# Patient Record
Sex: Male | Born: 1970
Health system: Southern US, Community
[De-identification: ages and names within clinical notes are randomized; demographics above are authoritative.]

## PROBLEM LIST (undated history)

## (undated) DIAGNOSIS — I7121 Aneurysm of the ascending aorta, without rupture: Secondary | ICD-10-CM

## (undated) DIAGNOSIS — I712 Thoracic aortic aneurysm, without rupture: Secondary | ICD-10-CM

## (undated) DIAGNOSIS — I10 Essential (primary) hypertension: Secondary | ICD-10-CM

## (undated) HISTORY — DX: Aneurysm of the ascending aorta, without rupture: I71.21

## (undated) HISTORY — DX: Essential (primary) hypertension: I10

## (undated) HISTORY — DX: Thoracic aortic aneurysm, without rupture: I71.2

---

## 1999-11-01 ENCOUNTER — Emergency Department (HOSPITAL_COMMUNITY): Admission: EM | Admit: 1999-11-01 | Discharge: 1999-11-01 | Payer: Self-pay | Admitting: Emergency Medicine

## 1999-11-02 ENCOUNTER — Emergency Department (HOSPITAL_COMMUNITY): Admission: EM | Admit: 1999-11-02 | Discharge: 1999-11-02 | Payer: Self-pay

## 1999-11-07 ENCOUNTER — Emergency Department (HOSPITAL_COMMUNITY): Admission: EM | Admit: 1999-11-07 | Discharge: 1999-11-07 | Payer: Self-pay | Admitting: Emergency Medicine

## 2003-02-13 ENCOUNTER — Encounter: Payer: Self-pay | Admitting: Emergency Medicine

## 2003-02-13 ENCOUNTER — Emergency Department (HOSPITAL_COMMUNITY): Admission: EM | Admit: 2003-02-13 | Discharge: 2003-02-13 | Payer: Self-pay | Admitting: Emergency Medicine

## 2004-02-03 ENCOUNTER — Emergency Department (HOSPITAL_COMMUNITY): Admission: EM | Admit: 2004-02-03 | Discharge: 2004-02-03 | Payer: Self-pay | Admitting: Family Medicine

## 2013-12-09 ENCOUNTER — Emergency Department (HOSPITAL_COMMUNITY)
Admission: EM | Admit: 2013-12-09 | Discharge: 2013-12-09 | Disposition: A | Discharge status: Home / Self Care | Payer: Self-pay | Attending: Emergency Medicine | Admitting: Emergency Medicine

## 2013-12-09 ENCOUNTER — Emergency Department (HOSPITAL_COMMUNITY): Payer: Self-pay

## 2013-12-09 ENCOUNTER — Encounter (HOSPITAL_COMMUNITY): Payer: Self-pay | Admitting: Emergency Medicine

## 2013-12-09 DIAGNOSIS — Z791 Long term (current) use of non-steroidal anti-inflammatories (NSAID): Secondary | ICD-10-CM | POA: Insufficient documentation

## 2013-12-09 DIAGNOSIS — M7730 Calcaneal spur, unspecified foot: Secondary | ICD-10-CM | POA: Insufficient documentation

## 2013-12-09 DIAGNOSIS — M7732 Calcaneal spur, left foot: Secondary | ICD-10-CM

## 2013-12-09 DIAGNOSIS — M722 Plantar fascial fibromatosis: Secondary | ICD-10-CM | POA: Insufficient documentation

## 2013-12-09 MED ORDER — NAPROXEN 500 MG PO TABS: 500.0000 mg | ORAL_TABLET | Freq: Two times a day (BID) | ORAL | Status: DC

## 2013-12-09 NOTE — ED Provider Notes (Signed)
CSN: [id number]     Arrival date & time 12/09/13  1019 History   First MD Initiated Contact with Patient 12/09/13 1023     Chief Complaint  Patient presents with  . Foot Pain    left     (Consider location/radiation/quality/duration/timing/severity/associated sxs/prior Treatment) HPI Comments: 43 year old male presents to the emergency department complaining of left foot pain "for a while" worsening over the past few days. No known injury or trauma. Pain worse first thing in the morning, easing off throughout the day. Pain also worse if he is sitting in his truck for long period of time and then goes to walk. States the pain starts near his heel and radiates to the bottom of his foot. He has not tried any alleviating factors for his symptoms. He has not tried any alleviating factors for his symptoms. Denies fevers, numbness or tingling.  Patient is a 43 y.o. male presenting with lower extremity pain. The history is provided by the patient.  Foot Pain Pertinent negatives include no fever or numbness.    History reviewed. No pertinent past medical history. History reviewed. No pertinent past surgical history. No family history on file. History  Substance Use Topics  . Smoking status: Never Smoker   . Smokeless tobacco: Not on file  . Alcohol Use: Yes    Review of Systems  Constitutional: Negative for fever.  HENT: Negative.   Musculoskeletal:       Positive for left foot pain.  Skin: Negative.   Neurological: Negative for numbness.      Allergies  Review of patient's allergies indicates no known allergies.  Home Medications   Prior to Admission medications   Medication Sig Start Date End Date Taking? Authorizing Provider  naproxen (NAPROSYN) 500 MG tablet Take 1 tablet (500 mg total) by mouth 2 (two) times daily. 12/09/13   Illene Labrador, PA-C   BP 144/101  Pulse 90  Temp(Src) 98 F (36.7 C) (Oral)  Resp 20  SpO2 99% Physical Exam  Nursing note and vitals  reviewed. Constitutional: He is oriented to person, place, and time. He appears well-developed and well-nourished. No distress.  HENT:  Head: Normocephalic and atraumatic.  Eyes: Conjunctivae and EOM are normal.  Neck: Normal range of motion. Neck supple.  Cardiovascular: Normal rate, regular rhythm and normal heart sounds.   +2 PT/DP pulse on the left.  Pulmonary/Chest: Effort normal and breath sounds normal.  Musculoskeletal: Normal range of motion. He exhibits no edema.  Left foot tender to palpation over his calcaneus and plantar aspect of his foot. No swelling or deformity. Full range of motion of left foot and ankle. Achilles tendon normal. Normal gait.  Neurological: He is alert and oriented to person, place, and time.  Sensation intact  Skin: Skin is warm and dry.  Psychiatric: He has a normal mood and affect. His behavior is normal.    ED Course  Procedures (including critical care time) Labs Review Labs Reviewed - No data to display  Imaging Review Dg Foot Complete Left  12/09/2013   CLINICAL DATA:  Left heel pain.  EXAM: LEFT FOOT - COMPLETE 3+ VIEW  COMPARISON:  None.  FINDINGS: No acute bony or joint abnormality is identified. A small well corticated plantar calcaneal spur is noted. Soft tissue structures are unremarkable.  IMPRESSION: No acute abnormality.  Small plantar calcaneal spur.   Electronically Signed   By: Inge Rise M.D.   On: 12/09/2013 10:41     EKG Interpretation None  MDM   Final diagnoses:  Plantar fasciitis of left foot  Calcaneal spur of foot, left   Patient presenting with left foot pain, no known injury or trauma, worse in the morning or after being non-ambulatory for a while. Symptoms consistent with plantar fasciitis. X-ray obtained prior to patient being seen, no acute abnormality, small plantar calcaneal spur. I discussed certain stretches for plantar fasciitis including rolling a lacrosse ball or golf ball underneath his foot, or a  frozen water bottle underneath his foot. Naproxen for pain. Stable for discharge. Resources given for PCP followup. Return precautions given. Patient states understanding of treatment care plan and is agreeable.  Illene Labrador, PA-C 12/09/13 1108

## 2013-12-09 NOTE — Discharge Instructions (Signed)
Take naproxen daily as directed. Freeze a water bottle and roll beneath your foot to stretch your plantar fascia. You may also use a golf ball or lacrosse ball to stretch out your plantar fascia.  Heel Spur A heel spur is a hook of bone that can form on the calcaneus (the heel bone and the largest bone of the foot). Heel spurs are often associated with plantar fasciitis and usually come in people who have had the problem for an extended period of time. The cause of the relationship is unknown. The pain associated with them is thought to be caused by an inflammation (soreness and redness) of the plantar fascia rather than the spur itself. The plantar fascia is a thick fibrous like tissue that runs from the calcaneus (heel bone) to the ball of the foot. This strong, tight tissue helps maintain the arch of your foot. It helps distribute the weight across your foot as you walk or run. Stresses placed on the plantar fascia can be tremendous. When it is inflamed normal activities become painful. Pain is worse in the morning after sleeping. After sleeping the plantar fascia is tight. The first movements stretch the fascia and this causes pain. As the tendon loosens, the pain usually gets better. It often returns with too much standing or walking.  About 70% of patients with plantar fasciitis have a heel spur. About half of people without foot pain also have heel spurs. DIAGNOSIS  The diagnosis of a heel spur is made by X-ray. The X-ray shows a hook of bone protruding from the bottom of the calcaneus at the point where the plantar fascia is attached to the heel bone.  TREATMENT  It is necessary to find out what is causing the stretching of the plantar fascia. If the cause is over-pronation (flat feet), orthotics and proper foot ware may help.  Stretching exercises, losing weight, wearing shoes that have a cushioned heel that absorbs shock, and elevating the heel with the use of a heel cradle, heel cup, or  orthotics may all help. Heel cradles and heel cups provide extra comfort and cushion to the heel, and reduce the amount of shock to the sore area. AVOIDING THE PAIN OF PLANTAR FASCIITIS AND HEEL SPURS  Consult a sports medicine professional before beginning a new exercise program.  Walking programs offer a good workout. There is a lower chance of overuse injuries common to the runners. There is less impact and less jarring of the joints.  Begin all new exercise programs slowly. If problems or pains develop, decrease the amount of time or distance until you are at a comfortable level.  Wear good shoes and replace them regularly.  Stretch your foot and the heel cords at the back of the ankle (Achilles tendons) both before and after exercise.  Run or exercise on even surfaces that are not hard. For example, asphalt is better than pavement.  Do not run barefoot on hard surfaces.  If using a treadmill, vary the incline.  Do not continue to workout if you have foot or joint problems. Seek professional help if they do not improve. HOME CARE INSTRUCTIONS   Avoid activities that cause you pain until you recover.  Use ice or cold packs to the problem or painful areas after working out.  Only take over-the-counter or prescription medicines for pain, discomfort, or fever as directed by your caregiver.  Soft shoe inserts or athletic shoes with air or gel sole cushions may be helpful.  If problems  continue or become more severe, consult a sports medicine caregiver. Cortisone is a potent anti-inflammatory medication that may be injected into the painful area. You can discuss this treatment with your caregiver. MAKE SURE YOU:   Understand these instructions.  Will watch your condition.  Will get help right away if you are not doing well or get worse. Document Released: 06/29/2005 Document Revised: 08/15/2011 Document Reviewed: 08/31/2005 [facility] Patient Information 2015 [place], [place]. This  information is not intended to replace advice given to you by your health care provider. Make sure you discuss any questions you have with your health care provider.  Plantar Fasciitis (Heel Spur Syndrome) with Rehab The plantar fascia is a fibrous, ligament-like, soft-tissue structure that spans the bottom of the foot. Plantar fasciitis is a condition that causes pain in the foot due to inflammation of the tissue. SYMPTOMS   Pain and tenderness on the underneath side of the foot.  Pain that worsens with standing or walking. CAUSES  Plantar fasciitis is caused by irritation and injury to the plantar fascia on the underneath side of the foot. Common mechanisms of injury include:  Direct trauma to bottom of the foot.  Damage to a small nerve that runs under the foot where the main fascia attaches to the heel bone.  Stress placed on the plantar fascia due to bone spurs. RISK INCREASES WITH:   Activities that place stress on the plantar fascia (running, jumping, pivoting, or cutting).  Poor strength and flexibility.  Improperly fitted shoes.  Tight calf muscles.  Flat feet.  Failure to warm-up properly before activity.  Obesity. PREVENTION  Warm up and stretch properly before activity.  Allow for adequate recovery between workouts.  Maintain physical fitness:  Strength, flexibility, and endurance.  Cardiovascular fitness.  Maintain a health body weight.  Avoid stress on the plantar fascia.  Wear properly fitted shoes, including arch supports for individuals who have flat feet. PROGNOSIS  If treated properly, then the symptoms of plantar fasciitis usually resolve without surgery. However, occasionally surgery is necessary. RELATED COMPLICATIONS   Recurrent symptoms that may result in a chronic condition.  Problems of the lower back that are caused by compensating for the injury, such as limping.  Pain or weakness of the foot during push-off following  surgery.  Chronic inflammation, scarring, and partial or complete fascia tear, occurring more often from repeated injections. TREATMENT  Treatment initially involves the use of ice and medication to help reduce pain and inflammation. The use of strengthening and stretching exercises may help reduce pain with activity, especially stretches of the Achilles tendon. These exercises may be performed at home or with a therapist. Your caregiver may recommend that you use heel cups of arch supports to help reduce stress on the plantar fascia. Occasionally, corticosteroid injections are given to reduce inflammation. If symptoms persist for greater than 6 months despite non-surgical (conservative), then surgery may be recommended.  MEDICATION   If pain medication is necessary, then nonsteroidal anti-inflammatory medications, such as aspirin and ibuprofen, or other minor pain relievers, such as acetaminophen, are often recommended.  Do not take pain medication within 7 days before surgery.  Prescription pain relievers may be given if deemed necessary by your caregiver. Use only as directed and only as much as you need.  Corticosteroid injections may be given by your caregiver. These injections should be reserved for the most serious cases, because they may only be given a certain number of times. HEAT AND COLD  Cold treatment (icing)  relieves pain and reduces inflammation. Cold treatment should be applied for 10 to 15 minutes every 2 to 3 hours for inflammation and pain and immediately after any activity that aggravates your symptoms. Use ice packs or massage the area with a piece of ice (ice massage).  Heat treatment may be used prior to performing the stretching and strengthening activities prescribed by your caregiver, physical therapist, or athletic trainer. Use a heat pack or soak the injury in warm water. SEEK IMMEDIATE MEDICAL CARE IF:  Treatment seems to offer no benefit, or the condition  worsens.  Any medications produce adverse side effects. EXERCISES RANGE OF MOTION (ROM) AND STRETCHING EXERCISES - Plantar Fasciitis (Heel Spur Syndrome) These exercises may help you when beginning to rehabilitate your injury. Your symptoms may resolve with or without further involvement from your physician, physical therapist or athletic trainer. While completing these exercises, remember:   Restoring tissue flexibility helps normal motion to return to the joints. This allows healthier, less painful movement and activity.  An effective stretch should be held for at least 30 seconds.  A stretch should never be painful. You should only feel a gentle lengthening or release in the stretched tissue. RANGE OF MOTION - Toe Extension, Flexion  Sit with your right / left leg crossed over your opposite knee.  Grasp your toes and gently pull them back toward the top of your foot. You should feel a stretch on the bottom of your toes and/or foot.  Hold this stretch for __________ seconds.  Now, gently pull your toes toward the bottom of your foot. You should feel a stretch on the top of your toes and or foot.  Hold this stretch for __________ seconds. Repeat __________ times. Complete this stretch __________ times per day.  RANGE OF MOTION - Ankle Dorsiflexion, Active Assisted  Remove shoes and sit on a chair that is preferably not on a carpeted surface.  Place right / left foot under knee. Extend your opposite leg for support.  Keeping your heel down, slide your right / left foot back toward the chair until you feel a stretch at your ankle or calf. If you do not feel a stretch, slide your bottom forward to the edge of the chair, while still keeping your heel down.  Hold this stretch for __________ seconds. Repeat __________ times. Complete this stretch __________ times per day.  STRETCH - Gastroc, Standing  Place hands on wall.  Extend right / left leg, keeping the front knee somewhat  bent.  Slightly point your toes inward on your back foot.  Keeping your right / left heel on the floor and your knee straight, shift your weight toward the wall, not allowing your back to arch.  You should feel a gentle stretch in the right / left calf. Hold this position for __________ seconds. Repeat __________ times. Complete this stretch __________ times per day. STRETCH - Soleus, Standing  Place hands on wall.  Extend right / left leg, keeping the other knee somewhat bent.  Slightly point your toes inward on your back foot.  Keep your right / left heel on the floor, bend your back knee, and slightly shift your weight over the back leg so that you feel a gentle stretch deep in your back calf.  Hold this position for __________ seconds. Repeat __________ times. Complete this stretch __________ times per day. STRETCH - Gastrocsoleus, Standing  Note: This exercise can place a lot of stress on your foot and ankle. Please complete this  exercise only if specifically instructed by your caregiver.   Place the ball of your right / left foot on a step, keeping your other foot firmly on the same step.  Hold on to the wall or a rail for balance.  Slowly lift your other foot, allowing your body weight to press your heel down over the edge of the step.  You should feel a stretch in your right / left calf.  Hold this position for __________ seconds.  Repeat this exercise with a slight bend in your right / left knee. Repeat __________ times. Complete this stretch __________ times per day.  STRENGTHENING EXERCISES - Plantar Fasciitis (Heel Spur Syndrome)  These exercises may help you when beginning to rehabilitate your injury. They may resolve your symptoms with or without further involvement from your physician, physical therapist or athletic trainer. While completing these exercises, remember:   Muscles can gain both the endurance and the strength needed for everyday activities through  controlled exercises.  Complete these exercises as instructed by your physician, physical therapist or athletic trainer. Progress the resistance and repetitions only as guided. STRENGTH - Towel Curls  Sit in a chair positioned on a non-carpeted surface.  Place your foot on a towel, keeping your heel on the floor.  Pull the towel toward your heel by only curling your toes. Keep your heel on the floor.  If instructed by your physician, physical therapist or athletic trainer, add ____________________ at the end of the towel. Repeat __________ times. Complete this exercise __________ times per day. STRENGTH - Ankle Inversion  Secure one end of a rubber exercise band/tubing to a fixed object (table, pole). Loop the other end around your foot just before your toes.  Place your fists between your knees. This will focus your strengthening at your ankle.  Slowly, pull your big toe up and in, making sure the band/tubing is positioned to resist the entire motion.  Hold this position for __________ seconds.  Have your muscles resist the band/tubing as it slowly pulls your foot back to the starting position. Repeat __________ times. Complete this exercises __________ times per day.  Document Released: 05/23/2005 Document Revised: 08/15/2011 Document Reviewed: 09/04/2008 [facility] Patient Information 2015 [place], [place]. This information is not intended to replace advice given to you by your health care provider. Make sure you discuss any questions you have with your health care provider.  Plantar Fasciitis Plantar fasciitis is a common condition that causes foot pain. It is soreness (inflammation) of the band of tough fibrous tissue on the bottom of the foot that runs from the heel bone (calcaneus) to the ball of the foot. The cause of this soreness may be from excessive standing, poor fitting shoes, running on hard surfaces, being overweight, having an abnormal walk, or overuse (this is common in  runners) of the painful foot or feet. It is also common in aerobic exercise dancers and ballet dancers. SYMPTOMS  Most people with plantar fasciitis complain of:  Severe pain in the morning on the bottom of their foot especially when taking the first steps out of bed. This pain recedes after a few minutes of walking.  Severe pain is experienced also during walking following a long period of inactivity.  Pain is worse when walking barefoot or up stairs DIAGNOSIS   Your caregiver will diagnose this condition by examining and feeling your foot.  Special tests such as X-rays of your foot, are usually not needed. PREVENTION   Consult a sports medicine professional  before beginning a new exercise program.  Walking programs offer a good workout. With walking there is a lower chance of overuse injuries common to runners. There is less impact and less jarring of the joints.  Begin all new exercise programs slowly. If problems or pain develop, decrease the amount of time or distance until you are at a comfortable level.  Wear good shoes and replace them regularly.  Stretch your foot and the heel cords at the back of the ankle (Achilles tendon) both before and after exercise.  Run or exercise on even surfaces that are not hard. For example, asphalt is better than pavement.  Do not run barefoot on hard surfaces.  If using a treadmill, vary the incline.  Do not continue to workout if you have foot or joint problems. Seek professional help if they do not improve. HOME CARE INSTRUCTIONS   Avoid activities that cause you pain until you recover.  Use ice or cold packs on the problem or painful areas after working out.  Only take over-the-counter or prescription medicines for pain, discomfort, or fever as directed by your caregiver.  Soft shoe inserts or athletic shoes with air or gel sole cushions may be helpful.  If problems continue or become more severe, consult a sports medicine  caregiver or your own health care provider. Cortisone is a potent anti-inflammatory medication that may be injected into the painful area. You can discuss this treatment with your caregiver. MAKE SURE YOU:   Understand these instructions.  Will watch your condition.  Will get help right away if you are not doing well or get worse. Document Released: 02/15/2001 Document Revised: 08/15/2011 Document Reviewed: 04/16/2008 [facility] Patient Information 2015 [place], [place]. This information is not intended to replace advice given to you by your health care provider. Make sure you discuss any questions you have with your health care provider.

## 2013-12-09 NOTE — ED Notes (Signed)
Pt left foot pain that has been hurting pt for a while, denies injury but states it has gotten worse.

## 2013-12-10 NOTE — ED Provider Notes (Signed)
Medical screening examination/treatment/procedure(s) were performed by non-physician practitioner and as supervising physician I was immediately available for consultation/collaboration.    Mirna Mires, MD 12/10/13 [phone]

## 2014-08-17 ENCOUNTER — Encounter (HOSPITAL_COMMUNITY): Payer: Self-pay

## 2014-08-17 ENCOUNTER — Emergency Department (HOSPITAL_COMMUNITY)
Admission: EM | Admit: 2014-08-17 | Discharge: 2014-08-18 | Disposition: A | Discharge status: Home / Self Care | Payer: Self-pay | Attending: Emergency Medicine | Admitting: Emergency Medicine

## 2014-08-17 ENCOUNTER — Emergency Department (HOSPITAL_COMMUNITY): Payer: Self-pay

## 2014-08-17 DIAGNOSIS — J069 Acute upper respiratory infection, unspecified: Secondary | ICD-10-CM | POA: Insufficient documentation

## 2014-08-17 DIAGNOSIS — I719 Aortic aneurysm of unspecified site, without rupture: Secondary | ICD-10-CM | POA: Insufficient documentation

## 2014-08-17 DIAGNOSIS — R05 Cough: Secondary | ICD-10-CM

## 2014-08-17 DIAGNOSIS — I77810 Thoracic aortic ectasia: Secondary | ICD-10-CM

## 2014-08-17 DIAGNOSIS — R042 Hemoptysis: Secondary | ICD-10-CM | POA: Insufficient documentation

## 2014-08-17 DIAGNOSIS — R059 Cough, unspecified: Secondary | ICD-10-CM

## 2014-08-17 LAB — CBC WITH DIFFERENTIAL/PLATELET
BASOS ABS: 0 10*3/uL (ref 0.0–0.1)
Basophils Relative: 0 % (ref 0–1)
EOS ABS: 0 10*3/uL (ref 0.0–0.7)
Eosinophils Relative: 0 % (ref 0–5)
HCT: 45.2 % (ref 39.0–52.0)
Hemoglobin: 15.6 g/dL (ref 13.0–17.0)
LYMPHS ABS: 1.3 10*3/uL (ref 0.7–4.0)
Lymphocytes Relative: 42 % (ref 12–46)
MCH: 30.3 pg (ref 26.0–34.0)
MCHC: 34.5 g/dL (ref 30.0–36.0)
MCV: 87.8 fL (ref 78.0–100.0)
MONOS PCT: 13 % — AB (ref 3–12)
Monocytes Absolute: 0.4 10*3/uL (ref 0.1–1.0)
NEUTROS ABS: 1.4 10*3/uL — AB (ref 1.7–7.7)
NEUTROS PCT: 45 % (ref 43–77)
Platelets: 189 10*3/uL (ref 150–400)
RBC: 5.15 MIL/uL (ref 4.22–5.81)
RDW: 13.1 % (ref 11.5–15.5)
WBC: 3.1 10*3/uL — ABNORMAL LOW (ref 4.0–10.5)

## 2014-08-17 LAB — BASIC METABOLIC PANEL
Anion gap: 8 (ref 5–15)
BUN: 14 mg/dL (ref 6–23)
CALCIUM: 8.9 mg/dL (ref 8.4–10.5)
CO2: 26 mmol/L (ref 19–32)
Chloride: 103 mmol/L (ref 96–112)
Creatinine, Ser: 1.04 mg/dL (ref 0.50–1.35)
GFR calc Af Amer: >90 mL/min (ref >90)
GFR calc non Af Amer: 86 mL/min — ABNORMAL LOW (ref >90)
GLUCOSE: 101 mg/dL — AB (ref 70–99)
Potassium: 3.5 mmol/L (ref 3.5–5.1)
SODIUM: 137 mmol/L (ref 135–145)

## 2014-08-17 LAB — I-STAT CHEM 8, ED
BUN: 19 mg/dL (ref 6–23)
CALCIUM ION: 1.1 mmol/L — AB (ref 1.12–1.23)
CREATININE: 1.1 mg/dL (ref 0.50–1.35)
Chloride: 100 mmol/L (ref 96–112)
GLUCOSE: 103 mg/dL — AB (ref 70–99)
HEMATOCRIT: 50 % (ref 39.0–52.0)
Hemoglobin: 17 g/dL (ref 13.0–17.0)
Potassium: 4.1 mmol/L (ref 3.5–5.1)
Sodium: 139 mmol/L (ref 135–145)
TCO2: 24 mmol/L (ref 0–100)

## 2014-08-17 MED ORDER — GUAIFENESIN-CODEINE 100-10 MG/5ML PO SOLN: 5.0000 mL | Freq: Four times a day (QID) | ORAL | Status: DC | PRN

## 2014-08-17 MED ORDER — AZITHROMYCIN 250 MG PO TABS: ORAL_TABLET | ORAL | Status: DC

## 2014-08-17 MED ORDER — IOHEXOL 350 MG/ML SOLN
100.0000 mL | Freq: Once | INTRAVENOUS | Status: AC | PRN
  Administered 2014-08-17: 100 mL via INTRAVENOUS

## 2014-08-17 NOTE — ED Notes (Signed)
Pt reports he has had a cough since Wednesday of last week. Pt reports he has been coughing up some blood.

## 2014-08-17 NOTE — ED Provider Notes (Signed)
CSN: [id number]     Arrival date & time 08/17/14  1633 History   First MD Initiated Contact with Patient 08/17/14 2109     Chief Complaint  Patient presents with  . Cough     (Consider location/radiation/quality/duration/timing/severity/associated sxs/prior Treatment) HPI  Sean Hampton is a(n) 44 y.o. male who presents to the ED with cc of Hemoptysis. The patient developed URI sxs about 1 week ago with associated nasal congestion, sore throat. Patient states that over the past few days he has been coughing up bloody sputum. Patient presents with a water bottle full of pink tinged sputum. He denies fevers, chills, shortness of breath. He denies any recent confinement, unilateral calf swelling, cancer. Patient denies smoking. 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  No past medical history on file. History reviewed. No pertinent past surgical history. Family History  Problem Relation Age of Onset  . Diabetes Mother   . Diabetes Father    History  Substance Use Topics  . Smoking status: Never Smoker   . Smokeless tobacco: Never Used  . Alcohol Use: Yes     Comment: occasionally    Review of Systems  Ten systems reviewed and are negative for acute change, except as noted in the HPI.    Allergies  Review of patient's allergies indicates no known allergies.  Home Medications   Prior to Admission medications   Medication Sig Start Date End Date Taking? Authorizing Provider  azithromycin (ZITHROMAX Z-PAK) 250 MG tablet 2 po day one, then 1 daily x 4 days 08/17/14   Margarita Mail, PA-C  naproxen (NAPROSYN) 500 MG tablet Take 1 tablet (500 mg total) by mouth 2 (two) times daily. Patient not taking: Reported on 08/17/2014 12/09/13   Hessie Diener Hess, PA-C   BP 132/75 mmHg  Pulse 104  Temp(Src) 98.7 F (37.1 C) (Oral)  Resp 18  SpO2 99% Physical Exam  Constitutional: He appears  well-developed and well-nourished. No distress.  HENT:  Head: Normocephalic and atraumatic.  Mouth/Throat: Uvula is midline. Posterior oropharyngeal erythema present. No oropharyngeal exudate, posterior oropharyngeal edema or tonsillar abscesses.  Eyes: Conjunctivae are normal. No scleral icterus.  Neck: Normal range of motion. Neck supple.  Cardiovascular: Normal rate, regular rhythm and normal heart sounds.   Pulmonary/Chest: Effort normal and breath sounds normal. No respiratory distress.  Abdominal: Soft. There is no tenderness.  Musculoskeletal: He exhibits no edema.  Neurological: He is alert.  Skin: Skin is warm and dry. No petechiae noted. He is not diaphoretic.  Psychiatric: His behavior is normal.  Nursing note and vitals reviewed.   ED Course  Procedures (including critical care time) Labs Review Labs Reviewed  CBC WITH DIFFERENTIAL/PLATELET  BASIC METABOLIC PANEL  I-STAT CHEM 8, ED    Imaging Review Dg Chest 2 View  08/17/2014   CLINICAL DATA:  Three day history of cough, chest congestion and shortness of breath. Possible exposure to asbestos this week while working.  EXAM: CHEST  2 VIEW  COMPARISON:  None.  FINDINGS: Cardiomediastinal silhouette unremarkable. Lungs clear. Bronchovascular markings normal. Pulmonary vascularity normal. No pneumothorax. No pleural effusions. Visualized bony thorax intact.  IMPRESSION: Normal examination.   Electronically Signed   By: Evangeline Dakin M.D.   On: 08/17/2014 18:40     EKG Interpretation None      MDM   Final diagnoses:  Cough  URI (upper respiratory  infection)  Essential hypertension  Cough with hemoptysis    9:59 PM BP 132/75 mmHg  Pulse 104  Temp(Src) 98.7 F (37.1 C) (Oral)  Resp 18  SpO2 99%  Patient with tachycardia, hypertension, hemoptysis. He has a negative chest x-ray. Labs are currently pending. Patient will need CTA to rule out PE. HDS.     Patient CTA negative for PE or other major pulmonary  abnormality. Ct does does dilated Aortic Arch.  I have discussed the imaging findings.  Patient is uninsured and will be referred to the [facility] for mgmt of his HTN and he has been given a referral to Vascular for mgmt of his aortic dilatation. Patient advised of the need for monitioring.   Margarita Mail, PA-C 08/19/14 1218  Charlesetta Shanks, MD 08/24/14 [phone]

## 2014-08-17 NOTE — Discharge Instructions (Signed)
Hemoptysis Hemoptysis, which means coughing up blood, can be a sign of a minor problem or a serious medical condition. The blood that is coughed up may come from the lungs and airways. Coughed-up blood can also come from bleeding that occurs outside the lungs and airways. Blood can drain into the windpipe during a severe nosebleed or when blood is vomited from the stomach. Because hemoptysis can be a sign of something serious, a medical evaluation is required. For some people with hemoptysis, no definite cause is ever identified. CAUSES  The most common cause of hemoptysis is bronchitis. Some other common causes include:   A ruptured blood vessel caused by coughing or an infection.   A medical condition that causes damage to the large air passageways (bronchiectasis).   A blood clot in the lungs (pulmonary embolism).   Pneumonia.   Tuberculosis.   Breathing in a small foreign object.   Cancer. For some people with hemoptysis, no definite cause is ever identified.  HOME CARE INSTRUCTIONS  Only take over-the-counter or prescription medicines as directed by your caregiver. Do not use cough suppressants unless your caregiver approves.  If your caregiver prescribes antibiotic medicines, take them as directed. Finish them even if you start to feel better.  Do not smoke. Also avoid secondhand smoke.  Follow up with your caregiver as directed. SEEK IMMEDIATE MEDICAL CARE IF:   You cough up bloody mucus for longer than a week.  You have a blood-producing cough that is severe or getting worse.  You have a blood-producing cough thatcomes and goes over time.  You develop problems with your breathing.   You vomit blood.  You develop bloody or black-colored stools.  You have chest pain.   You develop night sweats.  You feel faint or pass out.   You have a fever or persistent symptoms for more than 2-3 days.  You have a fever and your symptoms suddenly get worse. MAKE  SURE YOU:  Understand these instructions.  Will watch your condition.  Will get help right away if you are not doing well or get worse. Document Released: 08/01/2001 Document Revised: 05/09/2012 Document Reviewed: 03/09/2012 [facility] Patient Information 2015 [place], [place]. This information is not intended to replace advice given to you by your health care provider. Make sure you discuss any questions you have with your health care provider.  Thoracic Aortic Aneurysm An aneurysm is a bulge in an artery. It happens when the wall of the artery is weakened or damaged. If the aneurysm gets too big, it bursts (ruptures) and severe bleeding occurs. A thoracic aortic aneurysm is an aneurysm that occurs in the first part of the aorta, between the heart and the diaphragm. The aorta is the main artery and supplies blood from the heart to the rest of the body. A thoracic aortic aneurysm can enlarge and rupture or blood can flow between the layers of the wall of the aorta through a tear (aorticdissection). Both of these conditions can cause bleeding inside the body and can be life threatening unless diagnosed and treated promptly. CAUSES  The exact cause of a thoracic aortic aneurysm is often unknown. Some contributing factors are:   A hardening of the arteries caused by the buildup of fat and other substances in the lining of a blood vessel (arteriosclerosis).  Inflammation of the walls of an artery (arteritis).  Connective tissue diseases, such as Marfan syndrome.  Injury or trauma to the aorta.  An infection, such as syphilis or staphylococcus, in  the wall of the aorta (infectious aortitis) caused by bacteria. RISK FACTORS  Risk factors that contribute to a thoracic aortic aneurysm may include:  Age older than 39 years.  High blood pressure (hypertension).  Male gender.  Ethnicity (white race).  Obesity.  Family history of aneurysm (first degree relatives only).  Tobacco  use. PREVENTION  The following healthy lifestyle habits may help decrease your risk of a thoracic aortic aneurysm:  Quitting smoking. Smoking can raise your blood pressure and cause arteriosclerosis.  Limiting or avoiding alcohol.  Keeping your blood pressure, blood sugar level, and cholesterol levels within normal limits.  Decreasing your salt intake. In some people, too much salt can raise blood pressure and increase your risk of abdominal aortic aneurysm.  Eating a diet low in saturated fats and cholesterol.  Increasing your fiber intake by including whole grains, vegetables, and fruits in your diet. Eating these foods may help lower blood pressure.  Maintaining a healthy weight.  Staying physically active and exercising regularly. SYMPTOMS  The symptoms of thoracic aortic aneurysm may vary depending on the size and rate of growth of the aneurysm. Most grow slowly and do not have any symptoms. When symptoms do occur, they may include:  Pain (chest, back, sides, or abdomen). The pain may vary in intensity. A sudden onset of severe pain may indicate that the aneurysm has ruptured.  Hoarseness.  Cough.  Shortness of breath.  Swallowing problems.  Nausea or vomiting or both. DIAGNOSIS  Since most unruptured thoracic aortic aneurysms have no symptoms, they are often discovered during diagnostic exams for other conditions. An aneurysm may be found during the following procedures:  Ultrasonography (a one-time screening for thoracic aortic aneurysm by ultrasonography is also recommended for all men aged 42-75 years who have ever smoked).  X-ray exams.  A CT scan.  An MRI.  Angiography or arteriography. TREATMENT  Treatment of a thoracic aortic aneurysm depends on the size of your aneurysm, your age, and risk factors for rupture. Medicine to control blood pressure and pain may be used to manage aneurysms smaller than 2.3 in (6 cm). Regular monitoring for enlargement may be  recommended by your health care provider if:  The aneurysm is 1.2-1.5 in (3-4 cm) in size (an annual ultrasonography may be recommended).  The aneurysm is 1.5-1.8 in (4-4.5 cm) in size (an ultrasonography every 6 months may be recommended).  The aneurysm is larger than 1.8 in (4.5 cm) in size (your health care provider may ask that you be examined by a vascular surgeon). If your aneurysm is larger than 2.2 in (5.5 cm) or if it is enlarging quickly, surgical repair may be recommended. There are two main methods for repair of an aneurysm:   Endovascular repair (a minimally invasive surgery).  Open repair. This method is used if an endovascular repair is not possible. Document Released: 05/23/2005 Document Revised: 03/13/2013 Document Reviewed: 12/03/2012 [facility] Patient Information 2015 [place], [place]. This information is not intended to replace advice given to you by your health care provider. Make sure you discuss any questions you have with your health care provider.

## 2014-08-17 NOTE — ED Notes (Signed)
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 Patient states the has been having a productive cough with white sputum streaked with blood, headache  since being on the job.

## 2014-08-20 ENCOUNTER — Institutional Professional Consult (permissible substitution) (INDEPENDENT_AMBULATORY_CARE_PROVIDER_SITE_OTHER): Payer: Self-pay | Admitting: Surgery

## 2014-08-20 ENCOUNTER — Encounter: Payer: Self-pay | Admitting: Surgery

## 2014-08-20 VITALS — BP 135/96 | HR 100 | Resp 20 | Ht 72.0 in | Wt 240.0 lb

## 2014-08-20 DIAGNOSIS — I712 Thoracic aortic aneurysm, without rupture: Secondary | ICD-10-CM

## 2014-08-20 DIAGNOSIS — I7121 Aneurysm of the ascending aorta, without rupture: Secondary | ICD-10-CM

## 2014-08-20 NOTE — Progress Notes (Signed)
Cardiothoracic Surgery Consultation   PCP is No PCP Per Patient Referring Provider is Charlesetta Shanks, MD  Chief Complaint  Patient presents with  . Thoracic Aortic Aneurysm    Surgical eval on ascending thoracic aorta, Chest CTA 08/16/13     HPI:  The patient is a 44 year old gentleman with no prior medical history who does not have a PCP and was seen in the ER recently due to coughing up blood tinged sputum for a few days after having a URI. He had a CT scan of the chest that showed a 4.1 cm ascending aortic aneurysm but no other abnormality. He was treated with Zithomax and guaifenesin and his hemoptysis has resolved.  History reviewed. No pertinent past medical history.  History reviewed. No pertinent past surgical history.  Family History  Problem Relation Age of Onset  . Diabetes Mother   . Diabetes Father   No family history of aneurysm disease  Social History History  Substance Use Topics  . Smoking status: Never Smoker   . Smokeless tobacco: Never Used  . Alcohol Use: Yes     Comment: occasionally    Current Outpatient Prescriptions  Medication Sig Dispense Refill  . azithromycin (ZITHROMAX Z-PAK) 250 MG tablet 2 po day one, then 1 daily x 4 days 5 tablet 0  . guaiFENesin-codeine 100-10 MG/5ML syrup Take 5-10 mLs by mouth every 6 (six) hours as needed for cough. 120 mL 0  . naproxen (NAPROSYN) 500 MG tablet Take 1 tablet (500 mg total) by mouth 2 (two) times daily. 20 tablet 0   No current facility-administered medications for this visit.    No Known Allergies  Review of Systems  Constitutional: Negative.   HENT: Negative.   Eyes: Negative.   Respiratory: Positive for cough. Negative for chest tightness and shortness of breath.   Cardiovascular: Negative for chest pain and leg swelling.  Gastrointestinal: Negative.   Endocrine: Negative.   Genitourinary: Negative.   Musculoskeletal: Negative.   Neurological: Negative.   Hematological: Negative.      BP 135/96 mmHg  Pulse 100  Resp 20  Ht 6' (1.829 m)  Wt 240 lb (108.863 kg)  BMI 32.54 kg/m2  SpO2 94% Physical Exam  Constitutional: He appears well-developed and well-nourished. No distress.  Cardiovascular: Normal rate, regular rhythm, normal heart sounds and intact distal pulses.  Exam reveals no gallop and no friction rub.   No murmur heard. Pulmonary/Chest: Effort normal and breath sounds normal. No respiratory distress.  Abdominal: Soft. Bowel sounds are normal. He exhibits no distension and no mass. There is no tenderness.     Diagnostic Tests:  CLINICAL DATA: Acute onset of productive cough. Hemoptysis. Headache. Recent job involving asbestos. Initial encounter.  EXAM: CT ANGIOGRAPHY CHEST WITH CONTRAST  TECHNIQUE: Multidetector CT imaging of the chest was performed using the standard protocol during bolus administration of intravenous contrast. Multiplanar CT image reconstructions and MIPs were obtained to evaluate the vascular anatomy.  CONTRAST: 175mL OMNIPAQUE IOHEXOL 350 MG/ML SOLN  COMPARISON: Chest radiograph performed earlier today at 6:25 p.m.  FINDINGS: There is no evidence of pulmonary embolus.  Minimal bibasilar atelectasis is noted. A few small blebs are seen at the left lung base. The lungs are otherwise clear. There is no evidence of significant focal consolidation, pleural effusion or pneumothorax. No masses are identified; no abnormal focal contrast enhancement is seen.  There is borderline aneurysmal dilatation of the ascending thoracic aorta, measuring up to 4.1 cm in maximal diameter. The  aortic arch is borderline prominent, measuring up to 3.7 cm in diameter. Scattered coronary artery calcifications are seen. Visualized mediastinal nodes are grossly unremarkable. No pericardial effusion is identified. The great vessels are unremarkable in appearance. No axillary lymphadenopathy is seen. The thyroid gland is  unremarkable in appearance.  The visualized portions of the liver and spleen are unremarkable. The visualized portions of the pancreas, gallbladder, stomach, adrenal glands and kidneys are within normal limits.  No acute osseous abnormalities are seen.  Review of the MIP images confirms the above findings.  IMPRESSION: 1. No evidence of pulmonary embolus. 2. Minimal bibasilar atelectasis noted. Few small blebs at the left lung base. Lungs otherwise clear. 3. Borderline aneurysmal dilatation of the ascending thoracic aorta, measuring up to 4.1 cm in maximal diameter. Borderline prominence of the aortic arch, measuring up to 3.7 cm in diameter. 4. Scattered coronary artery calcifications seen.   Electronically Signed  By: Garald Balding M.D.  On: 08/17/2014 23:25  Impression:  He has a mildly dilated ascending aorta at 4.1 cm. I think this should be followed up in one year with a CTA of the chest. I reviewed the CTA films with the patient and his wife and answered their questions. I stressed the importance of good blood pressure control and sodium restriction in that regard. I also stressed the importance of getting a PCP for routine health maintenance at his age.   Plan:  I will see him back in 1 year for a CTA of the chest.  Gaye Pollack, MD [facility] [phone]

## 2015-08-05 ENCOUNTER — Other Ambulatory Visit: Payer: Self-pay | Admitting: Surgery

## 2015-08-05 DIAGNOSIS — I729 Aneurysm of unspecified site: Secondary | ICD-10-CM

## 2015-08-26 ENCOUNTER — Inpatient Hospital Stay: Admission: RE | Admit: 2015-08-26 | Payer: Self-pay | Source: Ambulatory Visit

## 2015-08-26 ENCOUNTER — Ambulatory Visit: Payer: Self-pay | Admitting: Surgery

## 2015-09-02 ENCOUNTER — Inpatient Hospital Stay: Admission: RE | Admit: 2015-09-02 | Payer: Self-pay | Source: Ambulatory Visit

## 2016-05-04 ENCOUNTER — Emergency Department (HOSPITAL_COMMUNITY)
Admission: EM | Admit: 2016-05-04 | Discharge: 2016-05-04 | Disposition: A | Discharge status: Home / Self Care | Payer: 59 | Attending: Emergency Medicine | Admitting: Emergency Medicine

## 2016-05-04 ENCOUNTER — Encounter (HOSPITAL_COMMUNITY): Payer: Self-pay | Admitting: Emergency Medicine

## 2016-05-04 DIAGNOSIS — Z79899 Other long term (current) drug therapy: Secondary | ICD-10-CM | POA: Diagnosis not present

## 2016-05-04 DIAGNOSIS — M545 Low back pain: Secondary | ICD-10-CM | POA: Diagnosis present

## 2016-05-04 MED ORDER — KETOROLAC TROMETHAMINE 60 MG/2ML IM SOLN
60.0000 mg | Freq: Once | INTRAMUSCULAR | Status: AC
  Administered 2016-05-04: 60 mg via INTRAMUSCULAR
  Filled 2016-05-04: qty 2

## 2016-05-04 MED ORDER — CYCLOBENZAPRINE HCL 10 MG PO TABS: 10.0000 mg | ORAL_TABLET | Freq: Three times a day (TID) | ORAL | 0 refills | Status: DC | PRN

## 2016-05-04 MED ORDER — MELOXICAM 7.5 MG PO TABS: 7.5000 mg | ORAL_TABLET | Freq: Every day | ORAL | 0 refills | Status: DC

## 2016-05-04 MED ORDER — METHOCARBAMOL 500 MG PO TABS: 500.0000 mg | ORAL_TABLET | Freq: Four times a day (QID) | ORAL | 0 refills | Status: DC | PRN

## 2016-05-04 NOTE — Discharge Instructions (Signed)
Read the information below.  Use the prescribed medication as directed.  Please discuss all new medications with your pharmacist.  You may return to the Emergency Department at any time for worsening condition or any new symptoms that concern you.    If you develop fevers, loss of control of bowel or bladder, weakness or numbness in your legs, or are unable to walk, return to the ER for a recheck.  °

## 2016-05-04 NOTE — Progress Notes (Signed)
[place] health choice plus pt with back pain stating no pcp  [facility] ED CM noted pt with coverage but no pcp listed [facility] ED CM spoke with pt on how to obtain an in network pcp with insurance coverage via the customer service number or web site and gave pt a 10 page list of providers to assist with finding pcp for follow up care Cm reviewed ED level of care for crisis/emergent services and community pcp level of care to manage continuous or chronic medical concerns.  The pt voiced understanding CM encouraged pt and discussed pt's responsibility to verify with pt's insurance carrier that any recommended medical provider offered by any emergency room or a hospital provider is within the carrier's network. The pt voiced understanding

## 2016-05-04 NOTE — ED Triage Notes (Signed)
Patient reports lower back pain radiating down right leg since last night. Denies injury, numbness and tingling, and bowel or bladder changes. Ambulatory to triage.

## 2016-05-04 NOTE — ED Provider Notes (Signed)
[facility] DEPT Provider Note   CSN: [id number] Arrival date & time: 05/04/16  1401  By signing my name below, I, Rayna Sexton, attest that this documentation has been prepared under the direction and in the presence of [facility], PA-C. Electronically Signed: Rayna Sexton, ED Scribe. 05/04/16. 6:03 PM.    History   Chief Complaint Chief Complaint  Patient presents with  . Back Pain    HPI HPI Comments: Sean Hampton is a 45 y.o. male who presents to the Emergency Department complaining of constant, moderate, right lower back pain x 2 months which worsened beginning this morning. 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 He describes his pain as a twisting and it worsens with movement and ambulation and mildly alleviates with rest. His pain radiates down his posterior RLE to his right knee. He has applied Lidoderm patches w/o relief. Pt has not taken anything for his pain and does not have any known drug allergies. He denies a h/o CA or IVDU. He denies numbness, tingling, weakness, abdominal pain, dysuria, constipation, diarrhea, fevers, chills or other associated symptoms at this time.   PCP: None   The history is provided by the patient. No language interpreter was used.    History reviewed. No pertinent past medical history.  There are no active problems to display for this patient.   History reviewed. No pertinent surgical history.     Home Medications    Prior to Admission medications   Medication Sig Start Date End Date Taking? Authorizing Provider  azithromycin (ZITHROMAX Z-PAK) 250 MG tablet 2 po day one, then 1 daily x 4 days 08/17/14   Margarita Mail, PA-C  cyclobenzaprine (FLEXERIL) 10 MG tablet Take 1 tablet (10 mg total) by mouth 3 (three) times daily as needed for muscle spasms (and pain). 05/04/16   Clayton Bibles, PA-C  guaiFENesin-codeine 100-10 MG/5ML syrup Take 5-10 mLs by mouth every 6 (six) hours as  needed for cough. 08/17/14   Margarita Mail, PA-C  meloxicam (MOBIC) 7.5 MG tablet Take 1 tablet (7.5 mg total) by mouth daily. 05/04/16   Clayton Bibles, PA-C  naproxen (NAPROSYN) 500 MG tablet Take 1 tablet (500 mg total) by mouth 2 (two) times daily. 12/09/13   Carman Ching, PA-C    Family History Family History  Problem Relation Age of Onset  . Diabetes Mother   . Diabetes Father     Social History Social History  Substance Use Topics  . Smoking status: Never Smoker  . Smokeless tobacco: Never Used  . Alcohol use Yes     Comment: occasionally     Allergies   Patient has no known allergies.   Review of Systems Review of Systems  Constitutional: Negative for chills and fever.  Gastrointestinal: Negative for abdominal pain, constipation and diarrhea.  Genitourinary: Negative for dysuria.  Musculoskeletal: Positive for back pain and myalgias.  Neurological: Negative for weakness and numbness.   Physical Exam Updated Vital Signs BP (!) 137/103 (BP Location: Left Arm)   Pulse 75   Temp 97.9 F (36.6 C) (Oral)   Resp 18   Ht 5\' 11"  (1.803 m)   Wt 113.4 kg   SpO2 100%   BMI 34.87 kg/m   Physical Exam  Constitutional: He appears well-developed and well-nourished. No distress.  HENT:  Head: Normocephalic and atraumatic.  Neck: Neck supple.  Pulmonary/Chest: Effort normal.  Abdominal: Soft. He exhibits no distension. There is no tenderness. There  is no rebound and no guarding.  Musculoskeletal: He exhibits tenderness.  TTP to the right lumbar musculature.  Spine nontender, no crepitus, or stepoffs. Lower extremities:  Strength 5/5, sensation intact, distal pulses intact.     Neurological: He is alert.  Skin: He is not diaphoretic.  Nursing note and vitals reviewed.  ED Treatments / Results  Labs (all labs ordered are listed, but only abnormal results are displayed) Labs Reviewed - No data to display  EKG  EKG Interpretation None       Radiology No results  found.  Procedures Procedures  DIAGNOSTIC STUDIES: Oxygen Saturation is 97% on RA, normal by my interpretation.    COORDINATION OF CARE: 6:02 PM Discussed next steps with pt. Pt verbalized understanding and is agreeable with the plan.    Medications Ordered in ED Medications  ketorolac (TORADOL) injection 60 mg (60 mg Intramuscular Given 05/04/16 1826)     Initial Impression / Assessment and Plan / ED Course  I have reviewed the triage vital signs and the nursing notes.  Pertinent labs & imaging results that were available during my care of the patient were reviewed by me and considered in my medical decision making (see chart for details).  Clinical Course     Afebrile nontoxic patient with back pain.  No neurological deficits and normal neuro exam.  Patient is ambulatory.  No loss of bowel or bladder control.  No concern for cauda equina.  No fever, night sweats, weight loss, h/o cancer, IVDA, no recent procedure to back. No urinary symptoms suggestive of UTI or ureteral stone.  Supportive care and return precaution discussed. Appears safe for discharge at this time. Follow up as indicated in discharge paperwork.   I personally performed the services described in this documentation, which was scribed in my presence. The recorded information has been reviewed and is accurate.  Final Clinical Impressions(s) / ED Diagnoses   Final diagnoses:  Acute right-sided low back pain, with sciatica presence unspecified    New Prescriptions Discharge Medication List as of 05/04/2016  6:27 PM    START taking these medications   Details  cyclobenzaprine (FLEXERIL) 10 MG tablet Take 1 tablet (10 mg total) by mouth 3 (three) times daily as needed for muscle spasms (and pain)., Starting Wed 05/04/2016, Print         [place], PA-C 05/04/16 1902    Davonna Belling, MD 05/04/16 [phone]

## 2016-07-05 ENCOUNTER — Ambulatory Visit: Payer: Self-pay | Admitting: Family Medicine

## 2016-11-03 ENCOUNTER — Encounter: Payer: Self-pay | Admitting: Family Medicine

## 2016-11-03 ENCOUNTER — Ambulatory Visit (INDEPENDENT_AMBULATORY_CARE_PROVIDER_SITE_OTHER): Payer: 59 | Admitting: Family Medicine

## 2016-11-03 VITALS — BP 140/92 | HR 86 | Temp 98.3°F | Resp 17 | Ht 71.0 in | Wt 267.2 lb

## 2016-11-03 DIAGNOSIS — Z23 Encounter for immunization: Secondary | ICD-10-CM

## 2016-11-03 DIAGNOSIS — I77810 Thoracic aortic ectasia: Secondary | ICD-10-CM | POA: Diagnosis not present

## 2016-11-03 DIAGNOSIS — I10 Essential (primary) hypertension: Secondary | ICD-10-CM | POA: Insufficient documentation

## 2016-11-03 DIAGNOSIS — E669 Obesity, unspecified: Secondary | ICD-10-CM | POA: Diagnosis not present

## 2016-11-03 DIAGNOSIS — R030 Elevated blood-pressure reading, without diagnosis of hypertension: Secondary | ICD-10-CM

## 2016-11-03 LAB — CBC WITH DIFFERENTIAL/PLATELET
BASOS ABS: 0 10*3/uL (ref 0.0–0.1)
Basophils Relative: 0.5 % (ref 0.0–3.0)
Eosinophils Absolute: 0.1 10*3/uL (ref 0.0–0.7)
Eosinophils Relative: 1.2 % (ref 0.0–5.0)
HEMATOCRIT: 43.9 % (ref 39.0–52.0)
Hemoglobin: 14.7 g/dL (ref 13.0–17.0)
Lymphocytes Relative: 35 % (ref 12.0–46.0)
Lymphs Abs: 1.7 10*3/uL (ref 0.7–4.0)
MCHC: 33.4 g/dL (ref 30.0–36.0)
MCV: 87.9 fl (ref 78.0–100.0)
MONOS PCT: 7 % (ref 3.0–12.0)
Monocytes Absolute: 0.3 10*3/uL (ref 0.1–1.0)
NEUTROS PCT: 56.3 % (ref 43.0–77.0)
Neutro Abs: 2.7 10*3/uL (ref 1.4–7.7)
Platelets: 219 10*3/uL (ref 150.0–400.0)
RBC: 4.99 Mil/uL (ref 4.22–5.81)
RDW: 14.6 % (ref 11.5–15.5)
WBC: 4.8 10*3/uL (ref 4.0–10.5)

## 2016-11-03 LAB — BASIC METABOLIC PANEL
BUN: 15 mg/dL (ref 6–23)
CO2: 31 mEq/L (ref 19–32)
CREATININE: 1.2 mg/dL (ref 0.40–1.50)
Calcium: 10.2 mg/dL (ref 8.4–10.5)
Chloride: 104 mEq/L (ref 96–112)
GFR: 83.78 mL/min (ref >60.00)
Glucose, Bld: 103 mg/dL — ABNORMAL HIGH (ref 70–99)
Potassium: 4.3 mEq/L (ref 3.5–5.1)
Sodium: 141 mEq/L (ref 135–145)

## 2016-11-03 LAB — HEPATIC FUNCTION PANEL
ALBUMIN: 5.1 g/dL (ref 3.5–5.2)
ALT: 27 U/L (ref 0–53)
AST: 20 U/L (ref 0–37)
Alkaline Phosphatase: 54 U/L (ref 39–117)
Bilirubin, Direct: 0.2 mg/dL (ref 0.0–0.3)
Total Bilirubin: 0.9 mg/dL (ref 0.2–1.2)
Total Protein: 7.9 g/dL (ref 6.0–8.3)

## 2016-11-03 LAB — LIPID PANEL
CHOLESTEROL: 228 mg/dL — AB (ref 0–200)
HDL: 52 mg/dL (ref >39.00)
LDL CALC: 139 mg/dL — AB (ref 0–99)
NonHDL: 176.26
TRIGLYCERIDES: 187 mg/dL — AB (ref 0.0–149.0)
Total CHOL/HDL Ratio: 4
VLDL: 37.4 mg/dL (ref 0.0–40.0)

## 2016-11-03 LAB — TSH: TSH: 0.8 u[IU]/mL (ref 0.35–4.50)

## 2016-11-03 NOTE — Patient Instructions (Signed)
Schedule your complete physical in 6 months We'll notify you of your lab results and make any changes if needed Try and make healthy food choices and get regular exercise A low salt diet will improve blood pressure We'll call you with your vascular appt Call with any questions or concerns Welcome!  We're glad to have you!!!

## 2016-11-03 NOTE — Assessment & Plan Note (Signed)
New to provider.  Pt has hx of this.  Has seen Dr Cyndia Bent previously and was to follow up 1 year ago.  Will refer back for ongoing surveillance and/or intervention if needed.  Pt expressed understanding and is in agreement w/ plan.

## 2016-11-03 NOTE — Assessment & Plan Note (Signed)
New.  Pt's BP is mildly elevated today but he admits to being nervous for appt.  Stressed need for healthy diet and regular exercise to improve his readings.  Will follow.

## 2016-11-03 NOTE — Progress Notes (Signed)
   Subjective:    Patient ID: Sean Hampton, male    DOB: 1971/02/02, 46 y.o.   MRN: [id number]  HPI New to establish.  Previous MD- none recently  Dilated ascending aorta- measured at 4.1 cm in 2016.  Saw Dr Cyndia Bent and was recommended to have yearly f/u.  No CP, SOB, HAs, visual changes, edema.  Elevated BP- + family hx of HTN but no personal hx of elevated readings.  No swelling of hands/feet.  No CP, SOB, HAs, visual changes.  Obesity-  Pt's BMI is 37.27.  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  Not following a particular diet.  Health Maintenance- due for Tdap.   Review of Systems For ROS see HPI     Objective:   Physical Exam  Constitutional: He is oriented to person, place, and time. He appears well-developed and well-nourished. No distress.  obese  HENT:  Head: Normocephalic and atraumatic.  Eyes: Conjunctivae and EOM are normal. Pupils are equal, round, and reactive to light.  Neck: Normal range of motion. Neck supple. No thyromegaly present.  Cardiovascular: Normal rate, regular rhythm, normal heart sounds and intact distal pulses.   No murmur heard. Pulmonary/Chest: Effort normal and breath sounds normal. No respiratory distress.  Abdominal: Soft. Bowel sounds are normal. He exhibits no distension.  Musculoskeletal: He exhibits no edema.  Lymphadenopathy:    He has no cervical adenopathy.  Neurological: He is alert and oriented to person, place, and time. No cranial nerve deficit.  Skin: Skin is warm and dry.  Psychiatric: He has a normal mood and affect. His behavior is normal.  Vitals reviewed.         Assessment & Plan:

## 2016-11-03 NOTE — Progress Notes (Signed)
Pre visit review using our clinic review tool, if applicable. No additional management support is needed unless otherwise documented below in the visit note. 

## 2016-11-03 NOTE — Assessment & Plan Note (Signed)
New.  Pt admits to no formal exercise and not following a particular diet.  Stressed need for low carb diet and regular exercise- that work activity is not enough.  Check labs to risk stratify.  Will follow.

## 2016-11-14 ENCOUNTER — Telehealth: Payer: Self-pay | Admitting: Family Medicine

## 2016-11-14 NOTE — Telephone Encounter (Signed)
Please advise 

## 2016-11-14 NOTE — Telephone Encounter (Signed)
Patient has been informed of results. Stated verbal understanding.  

## 2016-11-14 NOTE — Telephone Encounter (Signed)
Pt calling for labs results from 5/31, pt states that he has not heard anything.

## 2016-11-14 NOTE — Telephone Encounter (Signed)
Sorry for the delay- they are now resulted.  Please call pt

## 2017-05-02 ENCOUNTER — Encounter: Payer: 59 | Admitting: Family Medicine

## 2017-08-02 ENCOUNTER — Encounter: Payer: Self-pay | Admitting: Family Medicine

## 2017-08-02 ENCOUNTER — Other Ambulatory Visit: Payer: Self-pay

## 2017-08-02 ENCOUNTER — Ambulatory Visit (INDEPENDENT_AMBULATORY_CARE_PROVIDER_SITE_OTHER): Payer: 59 | Admitting: Family Medicine

## 2017-08-02 VITALS — BP 138/88 | HR 76 | Temp 98.3°F | Resp 16 | Ht 71.0 in | Wt 269.4 lb

## 2017-08-02 DIAGNOSIS — I77810 Thoracic aortic ectasia: Secondary | ICD-10-CM | POA: Diagnosis not present

## 2017-08-02 DIAGNOSIS — E669 Obesity, unspecified: Secondary | ICD-10-CM | POA: Diagnosis not present

## 2017-08-02 DIAGNOSIS — R351 Nocturia: Secondary | ICD-10-CM | POA: Diagnosis not present

## 2017-08-02 DIAGNOSIS — Z0000 Encounter for general adult medical examination without abnormal findings: Secondary | ICD-10-CM

## 2017-08-02 NOTE — Progress Notes (Signed)
   Subjective:    Patient ID: Sean Hampton, male    DOB: Feb 21, 1971, 47 y.o.   MRN: [id number]  HPI CPE- UTD on Tdap.  Declines flu.     Review of Systems Patient reports no vision/hearing changes, anorexia, fever ,adenopathy, persistant/recurrent hoarseness, swallowing issues, chest pain, palpitations, edema, persistant/recurrent cough, hemoptysis, dyspnea (rest,exertional, paroxysmal nocturnal), gastrointestinal  bleeding (melena, rectal bleeding), abdominal pain, excessive heart burn, syncope, focal weakness, memory loss, numbness & tingling, skin/hair/nail changes, depression, anxiety, abnormal bruising/bleeding, musculoskeletal symptoms/signs.   + increased urination- pt reports he drinks 'a lot of water'.  sxs started 'a couple of months ago'.  Waking at night to use the bathroom- at least 4x each night.      Objective:   Physical Exam General Appearance:    Alert, cooperative, no distress, appears stated age, obese  Head:    Normocephalic, without obvious abnormality, atraumatic  Eyes:    PERRL, conjunctiva/corneas clear, EOM's intact, fundi    benign, both eyes       Ears:    Normal TM's and external ear canals, both ears  Nose:   Nares normal, septum midline, mucosa normal, no drainage   or sinus tenderness  Throat:   Lips, mucosa, and tongue normal; teeth and gums normal  Neck:   Supple, symmetrical, trachea midline, no adenopathy;       thyroid:  No enlargement/tenderness/nodules  Back:     Symmetric, no curvature, ROM normal, no CVA tenderness  Lungs:     Clear to auscultation bilaterally, respirations unlabored  Chest wall:    No tenderness or deformity  Heart:    Regular rate and rhythm, S1 and S2 normal, no murmur, rub   or gallop  Abdomen:     Soft, non-tender, bowel sounds active all four quadrants,    no masses, no organomegaly  Genitalia:    Deferred at pt's request  Rectal:    Extremities:   Extremities normal, atraumatic, no cyanosis or edema  Pulses:   2+ and  symmetric all extremities  Skin:   Skin color, texture, turgor normal, no rashes or lesions  Lymph nodes:   Cervical, supraclavicular, and axillary nodes normal  Neurologic:   CNII-XII intact. Normal strength, sensation and reflexes      throughout          Assessment & Plan:  Nocturia- new.  Pt reports waking 'at least 4x/night'.  He reports increased water intake recently.  Refusing DRE in office so not able to assess for BPH.  Must also consider DM given obesity.  Check labs.  If PSA elevated or sugar WNL, will refer to Urology.  Pt expressed understanding and is in agreement w/ plan.

## 2017-08-02 NOTE — Assessment & Plan Note (Signed)
Ongoing issue.  Pt is not exercising or following a particular diet.  Stressed need for both.  Check labs to risk stratify.  Will follow.

## 2017-08-02 NOTE — Assessment & Plan Note (Signed)
Pt never called Dr Vivi Martens office as instructed 6 months ago (referral was placed).  Phone number was provided today w/ instructions to call.

## 2017-08-02 NOTE — Assessment & Plan Note (Signed)
Pt's PE WNL w/ exception of obesity.  UTD on Tdap.  Declines flu.  Check labs.  Anticipatory guidance provided.  

## 2017-08-02 NOTE — Patient Instructions (Signed)
Follow up in 6 months to recheck blood pressure We'll notify you of your lab results and make any changes if needed Continue to work on healthy diet and regular exercise- you can do it! CALL DR BARTLE'S OFFICE at [phone] to schedule a follow up appt Call with any questions or concerns Happy Spring!!!

## 2017-08-03 ENCOUNTER — Other Ambulatory Visit: Payer: Self-pay | Admitting: Emergency Medicine

## 2017-08-03 DIAGNOSIS — R351 Nocturia: Secondary | ICD-10-CM

## 2017-08-03 LAB — CBC WITH DIFFERENTIAL/PLATELET
Basophils Absolute: 18 cells/uL (ref 0–200)
Basophils Relative: 0.3 %
Eosinophils Absolute: 140 cells/uL (ref 15–500)
Eosinophils Relative: 2.3 %
HCT: 42.5 % (ref 38.5–50.0)
Hemoglobin: 14.6 g/dL (ref 13.2–17.1)
Lymphs Abs: 1940 cells/uL (ref 850–3900)
MCH: 29.2 pg (ref 27.0–33.0)
MCHC: 34.4 g/dL (ref 32.0–36.0)
MCV: 85 fL (ref 80.0–100.0)
MONOS PCT: 12.3 %
MPV: 10.4 fL (ref 7.5–12.5)
NEUTROS PCT: 53.3 %
Neutro Abs: 3251 cells/uL (ref 1500–7800)
PLATELETS: 226 10*3/uL (ref 140–400)
RBC: 5 10*6/uL (ref 4.20–5.80)
RDW: 14.3 % (ref 11.0–15.0)
Total Lymphocyte: 31.8 %
WBC mixed population: 750 cells/uL (ref 200–950)
WBC: 6.1 10*3/uL (ref 3.8–10.8)

## 2017-08-03 LAB — BASIC METABOLIC PANEL
BUN: 17 mg/dL (ref 7–25)
CALCIUM: 10.1 mg/dL (ref 8.6–10.3)
CO2: 29 mmol/L (ref 20–32)
Chloride: 104 mmol/L (ref 98–110)
Creat: 1.14 mg/dL (ref 0.60–1.35)
Glucose, Bld: 68 mg/dL (ref 65–99)
Potassium: 4.3 mmol/L (ref 3.5–5.3)
Sodium: 141 mmol/L (ref 135–146)

## 2017-08-03 LAB — HEPATIC FUNCTION PANEL
AG Ratio: 1.6 (calc) (ref 1.0–2.5)
ALKALINE PHOSPHATASE (APISO): 52 U/L (ref 40–115)
ALT: 29 U/L (ref 9–46)
AST: 23 U/L (ref 10–40)
Albumin: 4.7 g/dL (ref 3.6–5.1)
Bilirubin, Direct: 0.1 mg/dL (ref 0.0–0.2)
Globulin: 3 g/dL (calc) (ref 1.9–3.7)
Indirect Bilirubin: 0.5 mg/dL (calc) (ref 0.2–1.2)
TOTAL PROTEIN: 7.7 g/dL (ref 6.1–8.1)
Total Bilirubin: 0.6 mg/dL (ref 0.2–1.2)

## 2017-08-03 LAB — LIPID PANEL
Cholesterol: 219 mg/dL — ABNORMAL HIGH (ref <200)
HDL: 55 mg/dL (ref >40)
LDL CHOLESTEROL (CALC): 141 mg/dL — AB
NON-HDL CHOLESTEROL (CALC): 164 mg/dL — AB (ref <130)
TRIGLYCERIDES: 114 mg/dL (ref <150)
Total CHOL/HDL Ratio: 4 (calc) (ref <5.0)

## 2017-08-03 LAB — TSH: TSH: 0.86 m[IU]/L (ref 0.40–4.50)

## 2017-08-03 LAB — PSA: PSA: 0.5 ng/mL (ref <=4.0)

## 2017-11-28 ENCOUNTER — Emergency Department (HOSPITAL_COMMUNITY)
Admission: EM | Admit: 2017-11-28 | Discharge: 2017-11-28 | Disposition: A | Discharge status: Home / Self Care | Payer: 59 | Attending: Emergency Medicine | Admitting: Emergency Medicine

## 2017-11-28 ENCOUNTER — Encounter (HOSPITAL_COMMUNITY): Payer: Self-pay | Admitting: Emergency Medicine

## 2017-11-28 DIAGNOSIS — M545 Low back pain: Secondary | ICD-10-CM | POA: Diagnosis present

## 2017-11-28 DIAGNOSIS — M5441 Lumbago with sciatica, right side: Secondary | ICD-10-CM | POA: Diagnosis not present

## 2017-11-28 MED ORDER — METHYLPREDNISOLONE SODIUM SUCC 125 MG IJ SOLR
125.0000 mg | Freq: Once | INTRAMUSCULAR | Status: AC
  Administered 2017-11-28: 125 mg via INTRAMUSCULAR
  Filled 2017-11-28: qty 2

## 2017-11-28 MED ORDER — KETOROLAC TROMETHAMINE 60 MG/2ML IM SOLN
60.0000 mg | Freq: Once | INTRAMUSCULAR | Status: AC
  Administered 2017-11-28: 60 mg via INTRAMUSCULAR
  Filled 2017-11-28: qty 2

## 2017-11-28 MED ORDER — DICLOFENAC SODIUM 75 MG PO TBEC: 75.0000 mg | DELAYED_RELEASE_TABLET | Freq: Two times a day (BID) | ORAL | 0 refills | Status: DC

## 2017-11-28 MED ORDER — METHOCARBAMOL 500 MG PO TABS: 500.0000 mg | ORAL_TABLET | Freq: Two times a day (BID) | ORAL | 0 refills | Status: DC

## 2017-11-28 NOTE — ED Triage Notes (Signed)
Patient c/o right lower back pain x2 days. Denies injury and urinary sx. Ambulatory. Reports pain worsens with movement.

## 2017-11-28 NOTE — ED Provider Notes (Signed)
[facility] DEPT Provider Note   CSN: [id number] Arrival date & time: 11/28/17  1220     History   Chief Complaint Chief Complaint  Patient presents with  . Back Pain    HPI Sean Hampton is a 47 y.o. male.  The history is provided by the patient. No language interpreter was used.  Back Pain   This is a new problem. The current episode started more than 2 days ago. The problem occurs constantly. The problem has been gradually worsening. The pain is associated with no known injury. The pain is present in the lumbar spine. The pain does not radiate. The pain is moderate. Pertinent negatives include no chest pain and no abdominal pain. He has tried nothing for the symptoms. The treatment provided no relief.   Pt reports aching low back.  Pt reports he has had similar in the past.  History reviewed. No pertinent past medical history.  Patient Active Problem List   Diagnosis Date Noted  . Physical exam 08/02/2017  . Obesity (BMI 35.0-39.9 without comorbidity) 11/03/2016  . Elevated BP without diagnosis of hypertension 11/03/2016  . Ascending aorta dilatation (HCC) 11/03/2016    History reviewed. No pertinent surgical history.      Home Medications    Prior to Admission medications   Medication Sig Start Date End Date Taking? Authorizing Provider  diclofenac (VOLTAREN) 75 MG EC tablet Take 1 tablet (75 mg total) by mouth 2 (two) times daily. 11/28/17   Fransico Meadow, PA-C  methocarbamol (ROBAXIN) 500 MG tablet Take 1 tablet (500 mg total) by mouth 2 (two) times daily. 11/28/17   Fransico Meadow, PA-C    Family History Family History  Problem Relation Age of Onset  . Diabetes Mother   . Diabetes Father   . Heart attack Father     Social History Social History   Tobacco Use  . Smoking status: Never Smoker  . Smokeless tobacco: Never Used  Substance Use Topics  . Alcohol use: Yes    Comment: occasionally  . Drug use: Yes    Types:  Marijuana    Comment: occasionally     Allergies   Patient has no known allergies.   Review of Systems Review of Systems  Cardiovascular: Negative for chest pain.  Gastrointestinal: Negative for abdominal pain.  Musculoskeletal: Positive for back pain.  All other systems reviewed and are negative.    Physical Exam Updated Vital Signs BP (!) 162/107 (BP Location: Right Arm)   Pulse 82   Temp 98.3 F (36.8 C) (Oral)   Resp 18   SpO2 99%   Physical Exam  Constitutional: He appears well-developed and well-nourished.  HENT:  Head: Normocephalic and atraumatic.  Eyes: Conjunctivae are normal.  Neck: Neck supple.  Cardiovascular: Normal rate and regular rhythm.  No murmur heard. Pulmonary/Chest: Effort normal and breath sounds normal. No respiratory distress.  Abdominal: Soft. There is no tenderness.  Musculoskeletal: He exhibits no edema.  Tender low back diffusely   Neurological: He is alert.  Skin: Skin is warm and dry.  Psychiatric: He has a normal mood and affect.  Nursing note and vitals reviewed.    ED Treatments / Results  Labs (all labs ordered are listed, but only abnormal results are displayed) Labs Reviewed - No data to display  EKG None  Radiology No results found.  Procedures Procedures (including critical care time)  Medications Ordered in ED Medications  methylPREDNISolone sodium succinate (SOLU-MEDROL) 125 mg/2 mL  injection 125 mg (125 mg Intramuscular Given 11/28/17 1251)  ketorolac (TORADOL) injection 60 mg (60 mg Intramuscular Given 11/28/17 1252)     Initial Impression / Assessment and Plan / ED Course  I have reviewed the triage vital signs and the nursing notes.  Pertinent labs & imaging results that were available during my care of the patient were reviewed by me and considered in my medical decision making (see chart for details).     MDM  Pt reports relief in the past with medications.  Pt given solumedrol and trorodol.  Pt  advised to follow up with his MD for recheck   Final Clinical Impressions(s) / ED Diagnoses   Final diagnoses:  Acute right-sided low back pain with right-sided sciatica    ED Discharge Orders        Ordered    methocarbamol (ROBAXIN) 500 MG tablet  2 times daily     11/28/17 1248    diclofenac (VOLTAREN) 75 MG EC tablet  2 times daily     11/28/17 1248    An After Visit Summary was printed and given to the patient.   Fransico Meadow, [place] 11/28/17 1750    Little, Wenda Overland, MD 11/30/17 [phone]

## 2018-01-17 ENCOUNTER — Encounter: Payer: Self-pay | Admitting: Physician Assistant

## 2018-01-17 ENCOUNTER — Other Ambulatory Visit: Payer: Self-pay

## 2018-01-17 ENCOUNTER — Ambulatory Visit: Payer: 59 | Admitting: Physician Assistant

## 2018-01-17 VITALS — BP 152/90 | HR 82 | Temp 97.7°F | Ht 70.0 in | Wt 249.2 lb

## 2018-01-17 DIAGNOSIS — B9689 Other specified bacterial agents as the cause of diseases classified elsewhere: Secondary | ICD-10-CM | POA: Diagnosis not present

## 2018-01-17 DIAGNOSIS — J208 Acute bronchitis due to other specified organisms: Secondary | ICD-10-CM | POA: Diagnosis not present

## 2018-01-17 MED ORDER — DOXYCYCLINE HYCLATE 100 MG PO CAPS: 100.0000 mg | ORAL_CAPSULE | Freq: Two times a day (BID) | ORAL | 0 refills | Status: DC

## 2018-01-17 MED ORDER — BENZONATATE 100 MG PO CAPS: 100.0000 mg | ORAL_CAPSULE | Freq: Two times a day (BID) | ORAL | 0 refills | Status: DC | PRN

## 2018-01-17 NOTE — Patient Instructions (Signed)
Take antibiotic (Doxycycline) as directed.  Increase fluids.  Get plenty of rest. Use Mucinex for congestion. Take the Tessalon perles as directed for cough. Take a daily probiotic (I recommend Align or Culturelle, but even Activia Yogurt may be beneficial).  A humidifier placed in the bedroom may offer some relief for a dry, scratchy throat of nasal irritation.  Read information below on acute bronchitis. Please call or return to clinic if symptoms are not improving.  Acute Bronchitis Bronchitis is when the airways that extend from the windpipe into the lungs get red, puffy, and painful (inflamed). Bronchitis often causes thick spit (mucus) to develop. This leads to a cough. A cough is the most common symptom of bronchitis. In acute bronchitis, the condition usually begins suddenly and goes away over time (usually in 2 weeks). Smoking, allergies, and asthma can make bronchitis worse. Repeated episodes of bronchitis may cause more lung problems.  HOME CARE  Rest.  Drink enough fluids to keep your pee (urine) clear or pale yellow (unless you need to limit fluids as told by your doctor).  Only take over-the-counter or prescription medicines as told by your doctor.  Avoid smoking and secondhand smoke. These can make bronchitis worse. If you are a smoker, think about using nicotine gum or skin patches. Quitting smoking will help your lungs heal faster.  Reduce the chance of getting bronchitis again by:  Washing your hands often.  Avoiding people with cold symptoms.  Trying not to touch your hands to your mouth, nose, or eyes.  Follow up with your doctor as told.  GET HELP IF: Your symptoms do not improve after 1 week of treatment. Symptoms include:  Cough.  Fever.  Coughing up thick spit.  Body aches.  Chest congestion.  Chills.  Shortness of breath.  Sore throat.  GET HELP RIGHT AWAY IF:   You have an increased fever.  You have chills.  You have severe shortness of  breath.  You have bloody thick spit (sputum).  You throw up (vomit) often.  You lose too much body fluid (dehydration).  You have a severe headache.  You faint.  MAKE SURE YOU:   Understand these instructions.  Will watch your condition.  Will get help right away if you are not doing well or get worse. Document Released: 11/09/2007 Document Revised: 01/23/2013 Document Reviewed: 11/13/2012 [facility] Patient Information 2015 [place], [place]. This information is not intended to replace advice given to you by your health care provider. Make sure you discuss any questions you have with your health care provider.

## 2018-01-17 NOTE — Progress Notes (Signed)
Patient presents to clinic today c/o 6 days of chest congestion and a cough that was initially dry but is now productive of a thick yellow sputum. Denies fever. Endorses chills/aches. Some nasal congestion and change to taste. Denies recent travel. Denies sick contacts. Has been taking Dayquil/Nyquil with mild improvement in symptoms. .   No past medical history on file.  No current outpatient medications on file prior to visit.   No current facility-administered medications on file prior to visit.     No Known Allergies  Family History  Problem Relation Age of Onset  . Diabetes Mother   . Diabetes Father   . Heart attack Father     Social History   Socioeconomic History  . Marital status: Single    Spouse name: Not on file  . Number of children: Not on file  . Years of education: Not on file  . Highest education level: Not on file  Occupational History  . Not on file  Social Needs  . Financial resource strain: Not on file  . Food insecurity:    Worry: Not on file    Inability: Not on file  . Transportation needs:    Medical: Not on file    Non-medical: Not on file  Tobacco Use  . Smoking status: Never Smoker  . Smokeless tobacco: Never Used  Substance and Sexual Activity  . Alcohol use: Yes    Comment: occasionally  . Drug use: Yes    Types: Marijuana    Comment: occasionally  . Sexual activity: Not on file  Lifestyle  . Physical activity:    Days per week: Not on file    Minutes per session: Not on file  . Stress: Not on file  Relationships  . Social connections:    Talks on phone: Not on file    Gets together: Not on file    Attends religious service: Not on file    Active member of club or organization: Not on file    Attends meetings of clubs or organizations: Not on file    Relationship status: Not on file  Other Topics Concern  . Not on file  Social History Narrative  . Not on file   Review of Systems - See HPI.  All other ROS are  negative.  BP (!) 152/90   Pulse 82   Temp 97.7 F (36.5 C)   Ht 5\' 10"  (1.778 m)   Wt 249 lb 3.2 oz (113 kg)   SpO2 98%   BMI 35.76 kg/m   Physical Exam  Constitutional: He is oriented to person, place, and time. He appears well-developed and well-nourished.  HENT:  Head: Normocephalic and atraumatic.  Right Ear: External ear normal.  Left Ear: External ear normal.  Nose: Nose normal.  Mouth/Throat: Oropharynx is clear and moist.  Eyes: Conjunctivae are normal.  Neck: Neck supple.  Cardiovascular: Normal rate, regular rhythm, normal heart sounds and intact distal pulses.  Pulmonary/Chest: Effort normal and breath sounds normal. No stridor. No respiratory distress. He has no wheezes. He has no rales. He exhibits no tenderness.  Neurological: He is alert and oriented to person, place, and time. No cranial nerve deficit.  Psychiatric: He has a normal mood and affect.  Vitals reviewed.  Assessment/Plan: 1. Acute bacterial bronchitis Rx Doxycycline.  Increase fluids.  Rest.  Saline nasal spray.  Probiotic.  Mucinex as directed.  Humidifier in bedroom. Tessalon per orders.  Call or return to clinic if symptoms are not  improving.  - benzonatate (TESSALON) 100 MG capsule; Take 1 capsule (100 mg total) by mouth 2 (two) times daily as needed for cough.  Dispense: 20 capsule; Refill: 0 - doxycycline (VIBRAMYCIN) 100 MG capsule; Take 1 capsule (100 mg total) by mouth 2 (two) times daily.  Dispense: 14 capsule; Refill: 0   Leeanne Rio, PA-C

## 2018-01-29 ENCOUNTER — Encounter: Payer: Self-pay | Admitting: Family Medicine

## 2018-01-29 ENCOUNTER — Ambulatory Visit: Payer: 59 | Admitting: Family Medicine

## 2018-01-29 ENCOUNTER — Other Ambulatory Visit: Payer: Self-pay

## 2018-01-29 VITALS — BP 138/90 | HR 75 | Temp 97.8°F | Resp 16 | Ht 70.0 in | Wt 255.4 lb

## 2018-01-29 DIAGNOSIS — M5431 Sciatica, right side: Secondary | ICD-10-CM | POA: Diagnosis not present

## 2018-01-29 DIAGNOSIS — R030 Elevated blood-pressure reading, without diagnosis of hypertension: Secondary | ICD-10-CM | POA: Diagnosis not present

## 2018-01-29 MED ORDER — METHOCARBAMOL 750 MG PO TABS: 750.0000 mg | ORAL_TABLET | Freq: Three times a day (TID) | ORAL | 0 refills | Status: DC | PRN

## 2018-01-29 NOTE — Progress Notes (Signed)
   Subjective:    Patient ID: Sean Hampton, male    DOB: Dec 08, 1970, 47 y.o.   MRN: [id number]  HPI Elevated BP- pt's BP was 152/90 on 8/14 at a visit for bronchitis and is 142/94 today.  Pt reports feeling well.  Has recovered from bronchitis.  No CP, SOB, HAs, visual changes, edema.  + family hx of HTN- 'everybody'.  Pt has gained 6 lbs since last visit.  Pt does not have personal hx of HTN.    Radicular LBP- pt reports he will have sxs 2-3 days out of the week.  Pain will radiate down back of R leg.  Pain is worst upon standing and then eases off.  No numbness or tingling.  Asymptomatic today.  Pt installs fencing for a living.  Pt had to go to ER in late June for similar.  Pain improved w/ steroids, toradol and was d/c'd home w/ Voltaren and Robaxin.   Review of Systems For ROS see HPI     Objective:   Physical Exam  Constitutional: He is oriented to person, place, and time. He appears well-developed and well-nourished. No distress.  obese  HENT:  Head: Normocephalic and atraumatic.  Eyes: Pupils are equal, round, and reactive to light. Conjunctivae and EOM are normal.  Neck: Normal range of motion. Neck supple. No thyromegaly present.  Cardiovascular: Normal rate, regular rhythm, normal heart sounds and intact distal pulses.  No murmur heard. Pulmonary/Chest: Effort normal and breath sounds normal. No respiratory distress.  Abdominal: Soft. Bowel sounds are normal. He exhibits no distension.  Musculoskeletal: He exhibits no edema or tenderness (no TTP over lumbar spine).  Lymphadenopathy:    He has no cervical adenopathy.  Neurological: He is alert and oriented to person, place, and time. No cranial nerve deficit.  (-) SLR bilaterally  Skin: Skin is warm and dry.  Psychiatric: He has a normal mood and affect. His behavior is normal.  Vitals reviewed.         Assessment & Plan:

## 2018-01-29 NOTE — Assessment & Plan Note (Signed)
New.  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  While asymptomatic today, he reports having sxs 2-3x/week.  Start Robaxin as this worked for him previously (after being seen in ER) and refer to PT for evaluation, tx, and HEP.  Pt expressed understanding and is in agreement w/ plan.

## 2018-01-29 NOTE — Patient Instructions (Addendum)
Follow up in 1 month to recheck BP We'll call you with your physical therapy appt for the back pain USE the Methocarbamol as needed for back spasm Limit your salt intake, drink plenty of water, work on healthy diet and regular exercise- this will improve your blood pressure Call with any questions or concerns Hang in there!!

## 2018-01-30 NOTE — Assessment & Plan Note (Signed)
Pt's BP is again in the hypertensive range and we discussed starting medication today.  Pt would prefer to work on lifestyle modifications as he admits that he has not been vigilant about this.  Will follow in short order and if still elevated, will start meds at next visit.  Pt expressed understanding and is in agreement w/ plan.

## 2018-03-05 ENCOUNTER — Ambulatory Visit: Payer: 59 | Admitting: Family Medicine

## 2018-03-14 ENCOUNTER — Ambulatory Visit: Payer: 59 | Admitting: Family Medicine

## 2018-04-03 ENCOUNTER — Ambulatory Visit: Payer: 59 | Admitting: Family Medicine

## 2018-04-26 ENCOUNTER — Ambulatory Visit: Payer: 59 | Admitting: Family Medicine

## 2018-05-14 ENCOUNTER — Encounter: Payer: Self-pay | Admitting: Family Medicine

## 2018-05-14 ENCOUNTER — Ambulatory Visit: Payer: 59 | Admitting: Family Medicine

## 2018-05-14 ENCOUNTER — Other Ambulatory Visit: Payer: Self-pay

## 2018-05-14 VITALS — BP 143/90 | HR 80 | Temp 99.0°F | Resp 17 | Ht 70.0 in | Wt 257.0 lb

## 2018-05-14 DIAGNOSIS — I10 Essential (primary) hypertension: Secondary | ICD-10-CM | POA: Diagnosis not present

## 2018-05-14 DIAGNOSIS — I77810 Thoracic aortic ectasia: Secondary | ICD-10-CM | POA: Diagnosis not present

## 2018-05-14 MED ORDER — AMLODIPINE BESYLATE 5 MG PO TABS: 5.0000 mg | ORAL_TABLET | Freq: Every day | ORAL | 3 refills | Status: DC

## 2018-05-14 NOTE — Assessment & Plan Note (Signed)
New.  Pt's BP remains elevated and given his family hx and aortic dilation, he needs tighter BP control.  Start Amlodipine and follow closely.

## 2018-05-14 NOTE — Patient Instructions (Signed)
Follow up in 1 month to recheck BP START the Amlodipine once daily for BP We'll call you with your appt w/ Dr Cyndia Bent Continue to work on healthy diet and regular exercise- you can do it! Call with any questions or concerns Happy Holidays!

## 2018-05-14 NOTE — Assessment & Plan Note (Signed)
Ongoing issue.  Pt has not followed up w/ Dr Vivi Martens office as directed.  Will refer back for ongoing surveillance.  Pt expressed understanding and is in agreement w/ plan.

## 2018-05-14 NOTE — Progress Notes (Signed)
   Subjective:    Patient ID: MELANIE PELLOT, male    DOB: 1971-01-09, 47 y.o.   MRN: [id number]  HPI HTN- pt's BP was elevated in August and is again high.  + strong family hx.  Pt reports 'feeling fine'.  No CP, SOB, HAs, visual changes.    Ascending aorta dilation- pt never followed up w/ Dr Vivi Martens office.  Will put in new referral.   Review of Systems For ROS see HPI     Objective:   Physical Exam  Constitutional: He is oriented to person, place, and time. He appears well-developed and well-nourished. No distress.  HENT:  Head: Normocephalic and atraumatic.  Eyes: Pupils are equal, round, and reactive to light. Conjunctivae and EOM are normal.  Neck: Normal range of motion. Neck supple. No thyromegaly present.  Cardiovascular: Normal rate, regular rhythm, normal heart sounds and intact distal pulses.  No murmur heard. Pulmonary/Chest: Effort normal and breath sounds normal. No respiratory distress.  Abdominal: Soft. Bowel sounds are normal. He exhibits no distension.  Musculoskeletal: He exhibits no edema.  Lymphadenopathy:    He has no cervical adenopathy.  Neurological: He is alert and oriented to person, place, and time. No cranial nerve deficit.  Skin: Skin is warm and dry.  Psychiatric: He has a normal mood and affect. His behavior is normal.  Vitals reviewed.         Assessment & Plan:

## 2018-05-16 ENCOUNTER — Other Ambulatory Visit: Payer: Self-pay | Admitting: *Deleted

## 2018-05-16 DIAGNOSIS — I712 Thoracic aortic aneurysm, without rupture, unspecified: Secondary | ICD-10-CM

## 2018-05-16 NOTE — Progress Notes (Unsigned)
Ct a 

## 2018-05-18 ENCOUNTER — Ambulatory Visit
Admission: RE | Admit: 2018-05-18 | Discharge: 2018-05-18 | Disposition: A | Discharge status: Home / Self Care | Payer: 59 | Source: Ambulatory Visit | Attending: Surgery | Admitting: Surgery

## 2018-05-18 DIAGNOSIS — I712 Thoracic aortic aneurysm, without rupture, unspecified: Secondary | ICD-10-CM

## 2018-05-18 MED ORDER — IOPAMIDOL (ISOVUE-370) INJECTION 76%
75.0000 mL | Freq: Once | INTRAVENOUS | Status: AC | PRN
  Administered 2018-05-18: 75 mL via INTRAVENOUS

## 2018-05-23 ENCOUNTER — Ambulatory Visit: Payer: 59 | Admitting: Surgery

## 2018-05-23 ENCOUNTER — Ambulatory Visit (INDEPENDENT_AMBULATORY_CARE_PROVIDER_SITE_OTHER): Payer: 59 | Admitting: Surgery

## 2018-05-23 VITALS — BP 148/100 | HR 88 | Resp 20 | Ht 70.0 in | Wt 257.0 lb

## 2018-05-23 DIAGNOSIS — I712 Thoracic aortic aneurysm, without rupture, unspecified: Secondary | ICD-10-CM

## 2018-05-24 ENCOUNTER — Encounter: Payer: Self-pay | Admitting: Surgery

## 2018-05-24 NOTE — Progress Notes (Signed)
HPI:  The patient returns today for follow-up of a 4.1 cm fusiform ascending aortic aneurysm.  I last saw him on 08/20/2014 and recommended follow-up in 1 year but he did not return until today after referral from Dr. Birdie Riddle.  He has been doing well without any chest or back pain.  He has had persistent problems with hypertension and recently saw Dr. Birdie Riddle.  He was started on amlodipine.  Current Outpatient Medications  Medication Sig Dispense Refill  . amLODipine (NORVASC) 5 MG tablet Take 1 tablet (5 mg total) by mouth daily. 30 tablet 3  . methocarbamol (ROBAXIN) 750 MG tablet Take 1 tablet (750 mg total) by mouth every 8 (eight) hours as needed for muscle spasms. 45 tablet 0   No current facility-administered medications for this visit.      Physical Exam: BP (!) 148/100   Pulse 88   Resp 20   Ht 5\' 10"  (1.778 m)   Wt 257 lb (116.6 kg)   SpO2 96% Comment: RA  BMI 36.88 kg/m  He looks well. Cardiac exam shows a regular rate and rhythm with normal heart sounds.  There is no murmur. Lungs are clear. There is no peripheral edema.  Diagnostic Tests:  CLINICAL DATA:  Asymptomatic thoracic aortic aneurysm, follow-up  EXAM: CT ANGIOGRAPHY CHEST WITH CONTRAST  TECHNIQUE: Multidetector CT imaging of the chest was performed using the standard protocol during bolus administration of intravenous contrast. Multiplanar CT image reconstructions and MIPs were obtained to evaluate the vascular anatomy.  CONTRAST:  22mL ISOVUE-370 IOPAMIDOL (ISOVUE-370) INJECTION 76%  COMPARISON:  08/17/2014  FINDINGS: Cardiovascular: Heart size normal. No pericardial effusion. Good contrast opacification of pulmonary artery branches. No filling defects to suggest acute PE. Scattered coronary calcifications. Good contrast opacification of the thoracic aorta. Transverse dimensions as follows:  3.7 cm sinuses of Valsalva  4.1 cm mid ascending  3.7 cm distal ascending/proximal  arch  3.3 cm distal arch/proximal descending  2.6 cm distal descending  No dissection or stenosis. Classic 3 vessel brachiocephalic arterial origin anatomy without proximal stenosis. No significant atheromatous plaque. Visualized proximal abdominal aorta is unremarkable.  Mediastinum/Nodes: No hilar or mediastinal adenopathy.  Lungs/Pleura: No pleural effusion. No pneumothorax. Small bleb in the left lower lobe. Lungs are otherwise clear.  Upper Abdomen: No acute abnormality.  Musculoskeletal: No chest wall abnormality. No acute or significant osseous findings.  Review of the MIP images confirms the above findings.  IMPRESSION: 1. Stable 4.1 cm ascending thoracic aortic aneurysm without complicating features. Recommend annual imaging followup by CTA or MRA. This recommendation follows 2010 ACCF/AHA/AATS/ACR/ASA/SCA/SCAI/SIR/STS/SVM Guidelines for the Diagnosis and Management of Patients with Thoracic Aortic Disease. Circulation. 2010; 121: L875-I433 2. Coronary calcifications. The severity of coronary artery disease and any potential stenosis cannot be assessed on this non-gated CT examination. Assessment for potential risk factor modification, dietary therapy or pharmacologic therapy may be warranted, if clinically indicated.   Electronically Signed   By: Lucrezia Europe M.D.   On: 05/18/2018 16:17  Impression:  Is a stable 4.1 cm fusiform ascending aortic aneurysm which has not changed from the prior study 3 years ago.  I reviewed the CTA images with him and answered his questions.  I stressed the importance of good blood pressure control.  I advised him against doing any heavy lifting of more than 35 pounds.  I recommended that we repeat his study in 1 year.  Plan:  He will return to see me in 1 year with a CT  scan of the chest without contrast.  I spent 15 minutes performing this established patient evaluation and > 50% of this time was spent face to face  counseling and coordinating the care of this patient's aortic aneurysm.    Gaye Pollack, MD [facility] [phone]

## 2018-06-14 ENCOUNTER — Ambulatory Visit: Payer: 59 | Admitting: Family Medicine

## 2018-06-28 ENCOUNTER — Ambulatory Visit: Payer: 59 | Admitting: Family Medicine

## 2018-07-11 ENCOUNTER — Ambulatory Visit: Payer: 59 | Admitting: Family Medicine

## 2018-07-25 ENCOUNTER — Ambulatory Visit: Payer: 59 | Admitting: Family Medicine

## 2018-08-20 ENCOUNTER — Other Ambulatory Visit: Payer: Self-pay

## 2018-08-20 ENCOUNTER — Ambulatory Visit: Payer: 59 | Admitting: Family Medicine

## 2018-08-20 ENCOUNTER — Encounter: Payer: Self-pay | Admitting: Family Medicine

## 2018-08-20 VITALS — BP 168/101 | HR 99 | Temp 98.1°F | Resp 17 | Ht 70.0 in | Wt 252.0 lb

## 2018-08-20 DIAGNOSIS — E669 Obesity, unspecified: Secondary | ICD-10-CM | POA: Diagnosis not present

## 2018-08-20 DIAGNOSIS — I10 Essential (primary) hypertension: Secondary | ICD-10-CM | POA: Diagnosis not present

## 2018-08-20 DIAGNOSIS — I77810 Thoracic aortic ectasia: Secondary | ICD-10-CM

## 2018-08-20 LAB — HEPATIC FUNCTION PANEL
ALK PHOS: 57 U/L (ref 39–117)
ALT: 25 U/L (ref 0–53)
AST: 20 U/L (ref 0–37)
Albumin: 4.7 g/dL (ref 3.5–5.2)
Bilirubin, Direct: 0.3 mg/dL (ref 0.0–0.3)
Total Bilirubin: 1.7 mg/dL — ABNORMAL HIGH (ref 0.2–1.2)
Total Protein: 7.7 g/dL (ref 6.0–8.3)

## 2018-08-20 LAB — LIPID PANEL
CHOLESTEROL: 214 mg/dL — AB (ref 0–200)
HDL: 54.5 mg/dL (ref >39.00)
LDL CALC: 139 mg/dL — AB (ref 0–99)
NonHDL: 159.24
Total CHOL/HDL Ratio: 4
Triglycerides: 100 mg/dL (ref 0.0–149.0)
VLDL: 20 mg/dL (ref 0.0–40.0)

## 2018-08-20 LAB — TSH: TSH: 0.77 u[IU]/mL (ref 0.35–4.50)

## 2018-08-20 LAB — BASIC METABOLIC PANEL
BUN: 15 mg/dL (ref 6–23)
CO2: 27 mEq/L (ref 19–32)
Calcium: 9.5 mg/dL (ref 8.4–10.5)
Chloride: 100 mEq/L (ref 96–112)
Creatinine, Ser: 1.06 mg/dL (ref 0.40–1.50)
GFR: 90.25 mL/min (ref >60.00)
GLUCOSE: 101 mg/dL — AB (ref 70–99)
Potassium: 4 mEq/L (ref 3.5–5.1)
SODIUM: 136 meq/L (ref 135–145)

## 2018-08-20 LAB — CBC WITH DIFFERENTIAL/PLATELET
Basophils Absolute: 0 10*3/uL (ref 0.0–0.1)
Basophils Relative: 0.8 % (ref 0.0–3.0)
EOS PCT: 8.4 % — AB (ref 0.0–5.0)
Eosinophils Absolute: 0.4 10*3/uL (ref 0.0–0.7)
HEMATOCRIT: 45.2 % (ref 39.0–52.0)
Hemoglobin: 15.3 g/dL (ref 13.0–17.0)
Lymphocytes Relative: 24.6 % (ref 12.0–46.0)
Lymphs Abs: 1.1 10*3/uL (ref 0.7–4.0)
MCHC: 33.9 g/dL (ref 30.0–36.0)
MCV: 88.9 fl (ref 78.0–100.0)
Monocytes Absolute: 0.3 10*3/uL (ref 0.1–1.0)
Monocytes Relative: 7.4 % (ref 3.0–12.0)
Neutro Abs: 2.7 10*3/uL (ref 1.4–7.7)
Neutrophils Relative %: 58.8 % (ref 43.0–77.0)
Platelets: 211 10*3/uL (ref 150.0–400.0)
RBC: 5.08 Mil/uL (ref 4.22–5.81)
RDW: 13.6 % (ref 11.5–15.5)
WBC: 4.6 10*3/uL (ref 4.0–10.5)

## 2018-08-20 MED ORDER — AMLODIPINE BESYLATE 10 MG PO TABS: 10.0000 mg | ORAL_TABLET | Freq: Every day | ORAL | 3 refills | Status: DC

## 2018-08-20 MED ORDER — CETIRIZINE HCL 10 MG PO TABS: 10.0000 mg | ORAL_TABLET | Freq: Every day | ORAL | 11 refills | Status: DC

## 2018-08-20 NOTE — Patient Instructions (Addendum)
Follow up in 1 month to recheck BP We'll notify you of your lab results and make any changes if needed START the Amlodipine 1 tab daily (there are refills on this) Drink plenty of fluids- water is best Avoid salt! TAKE the Cetirizine (Zyrtec) daily to improve nasal congestion Call with any questions or concerns Hang in there!!

## 2018-08-20 NOTE — Assessment & Plan Note (Signed)
Deteriorated.  Pt stopped BP meds after the first 30 days when he didn't refill his pills.  BP is quite high today.  Again stressed the importance of BP control- particularly in the setting of his aneurysm.  Increase Amlodipine to 10mg  daily and stressed need for follow up.

## 2018-08-20 NOTE — Assessment & Plan Note (Signed)
Again discussed need for BP control and routine follow up.  Applauded him for following up w/ Dr Cyndia Bent in December.  Will follow.

## 2018-08-20 NOTE — Assessment & Plan Note (Signed)
Ongoing issue.  Stressed need for healthy diet and regular exercise as this will also improve BP control.  Will follow.

## 2018-08-20 NOTE — Progress Notes (Signed)
   Subjective:    Patient ID: Sean Hampton, male    DOB: [birth year], 48 y.o.   MRN: [id number]  HPI HTN- chronic problem.  Pt was seen in December and started on Amlodipine.  He stopped the medication in mid-January rather than getting a refill.  BP today is terrible.  No CP, SOB, HAs, visual changes, edema.  Pt reports no change in physical feeling after stopping BP meds.  Obesity- pt has lost 5 lbs but BMI remains 36.16.  URI- pt reports he developed thick nasal congestion on Friday.  No sinus pain/pressure.  Minimal cough.  'I can't taste, I can't smell'  Review of Systems For ROS see HPI     Objective:   Physical Exam Vitals signs reviewed.  Constitutional:      General: He is not in acute distress.    Appearance: He is well-developed. He is obese.  HENT:     Head: Normocephalic and atraumatic.     Nose: Congestion and rhinorrhea present.     Comments: No TTP over sinuses Eyes:     Conjunctiva/sclera: Conjunctivae normal.     Pupils: Pupils are equal, round, and reactive to light.  Neck:     Musculoskeletal: Normal range of motion and neck supple.     Thyroid: No thyromegaly.  Cardiovascular:     Rate and Rhythm: Normal rate and regular rhythm.     Heart sounds: Normal heart sounds. No murmur.  Pulmonary:     Effort: Pulmonary effort is normal. No respiratory distress.     Breath sounds: Normal breath sounds.  Abdominal:     General: Bowel sounds are normal. There is no distension.     Palpations: Abdomen is soft.  Lymphadenopathy:     Cervical: No cervical adenopathy.  Skin:    General: Skin is warm and dry.  Neurological:     Mental Status: He is alert and oriented to person, place, and time.     Cranial Nerves: No cranial nerve deficit.  Psychiatric:        Behavior: Behavior normal.           Assessment & Plan:

## 2018-08-21 ENCOUNTER — Encounter: Payer: Self-pay | Admitting: General Practice

## 2018-09-21 ENCOUNTER — Ambulatory Visit: Payer: 59 | Admitting: Family Medicine

## 2018-09-28 ENCOUNTER — Ambulatory Visit: Payer: 59 | Admitting: Family Medicine

## 2018-10-10 ENCOUNTER — Ambulatory Visit: Payer: 59 | Admitting: Family Medicine

## 2018-10-31 ENCOUNTER — Ambulatory Visit: Payer: 59 | Admitting: Family Medicine

## 2018-11-14 ENCOUNTER — Ambulatory Visit: Payer: 59 | Admitting: Family Medicine

## 2018-11-23 ENCOUNTER — Ambulatory Visit: Payer: 59 | Admitting: Family Medicine

## 2018-11-30 ENCOUNTER — Ambulatory Visit: Payer: 59 | Admitting: Family Medicine

## 2018-12-30 ENCOUNTER — Other Ambulatory Visit: Payer: Self-pay | Admitting: Family Medicine

## 2019-02-04 ENCOUNTER — Ambulatory Visit: Payer: 59 | Admitting: Physician Assistant

## 2019-02-04 ENCOUNTER — Encounter: Payer: Self-pay | Admitting: Physician Assistant

## 2019-02-04 ENCOUNTER — Other Ambulatory Visit: Payer: Self-pay

## 2019-02-04 VITALS — BP 140/90 | HR 93 | Temp 98.7°F | Resp 16 | Ht 70.0 in | Wt 264.0 lb

## 2019-02-04 DIAGNOSIS — M5442 Lumbago with sciatica, left side: Secondary | ICD-10-CM | POA: Diagnosis not present

## 2019-02-04 DIAGNOSIS — G8929 Other chronic pain: Secondary | ICD-10-CM

## 2019-02-04 NOTE — Patient Instructions (Signed)
Please go to the [facility] office for x-ray. They are unable to do x-ray today but can do tomorrow. The ladies at the front desk can help schedule this for you. We will call you with your results and alter treatment accordingly.   [address] [place]  [place], [place] [id number]  Continue Naproxen and Methocarbamol as directed, if needed for a flare of pain. Limit heavy lifting and over exertion.  We will determine need for further imaging versus PT assessment based on results.    Low Back Sprain or Strain Rehab Ask your health care provider which exercises are safe for you. Do exercises exactly as told by your health care provider and adjust them as directed. It is normal to feel mild stretching, pulling, tightness, or discomfort as you do these exercises. Stop right away if you feel sudden pain or your pain gets worse. Do not begin these exercises until told by your health care provider. Stretching and range-of-motion exercises These exercises warm up your muscles and joints and improve the movement and flexibility of your back. These exercises also help to relieve pain, numbness, and tingling. Lumbar rotation  1. Lie on your back on a firm surface and bend your knees. 2. Straighten your arms out to your sides so each arm forms a 90-degree angle (right angle) with a side of your body. 3. Slowly move (rotate) both of your knees to one side of your body until you feel a stretch in your lower back (lumbar). Try not to let your shoulders lift off the floor. 4. Hold this position for __________ seconds. 5. Tense your abdominal muscles and slowly move your knees back to the starting position. 6. Repeat this exercise on the other side of your body. Repeat __________ times. Complete this exercise __________ times a day. Single knee to chest  1. Lie on your back on a firm surface with both legs straight. 2. Bend one of your knees. Use your hands to move your knee up  toward your chest until you feel a gentle stretch in your lower back and buttock. ? Hold your leg in this position by holding on to the front of your knee. ? Keep your other leg as straight as possible. 3. Hold this position for __________ seconds. 4. Slowly return to the starting position. 5. Repeat with your other leg. Repeat __________ times. Complete this exercise __________ times a day. Prone extension on elbows  1. Lie on your abdomen on a firm surface (prone position). 2. Prop yourself up on your elbows. 3. Use your arms to help lift your chest up until you feel a gentle stretch in your abdomen and your lower back. ? This will place some of your body weight on your elbows. If this is uncomfortable, try stacking pillows under your chest. ? Your hips should stay down, against the surface that you are lying on. Keep your hip and back muscles relaxed. 4. Hold this position for __________ seconds. 5. Slowly relax your upper body and return to the starting position. Repeat __________ times. Complete this exercise __________ times a day. Strengthening exercises These exercises build strength and endurance in your back. Endurance is the ability to use your muscles for a long time, even after they get tired. Pelvic tilt This exercise strengthens the muscles that lie deep in the abdomen. 1. Lie on your back on a firm surface. Bend your knees and keep your feet flat on the floor. 2. Tense your abdominal muscles.  Tip your pelvis up toward the ceiling and flatten your lower back into the floor. ? To help with this exercise, you may place a small towel under your lower back and try to push your back into the towel. 3. Hold this position for __________ seconds. 4. Let your muscles relax completely before you repeat this exercise. Repeat __________ times. Complete this exercise __________ times a day. Alternating arm and leg raises  1. Get on your hands and knees on a firm surface. If you are on  a hard floor, you may want to use padding, such as an exercise mat, to cushion your knees. 2. Line up your arms and legs. Your hands should be directly below your shoulders, and your knees should be directly below your hips. 3. Lift your left leg behind you. At the same time, raise your right arm and straighten it in front of you. ? Do not lift your leg higher than your hip. ? Do not lift your arm higher than your shoulder. ? Keep your abdominal and back muscles tight. ? Keep your hips facing the ground. ? Do not arch your back. ? Keep your balance carefully, and do not hold your breath. 4. Hold this position for __________ seconds. 5. Slowly return to the starting position. 6. Repeat with your right leg and your left arm. Repeat __________ times. Complete this exercise __________ times a day. Abdominal set with straight leg raise  1. Lie on your back on a firm surface. 2. Bend one of your knees and keep your other leg straight. 3. Tense your abdominal muscles and lift your straight leg up, 4-6 inches (10-15 cm) off the ground. 4. Keep your abdominal muscles tight and hold this position for __________ seconds. ? Do not hold your breath. ? Do not arch your back. Keep it flat against the ground. 5. Keep your abdominal muscles tense as you slowly lower your leg back to the starting position. 6. Repeat with your other leg. Repeat __________ times. Complete this exercise __________ times a day. Single leg lower with bent knees 1. Lie on your back on a firm surface. 2. Tense your abdominal muscles and lift your feet off the floor, one foot at a time, so your knees and hips are bent in 90-degree angles (right angles). ? Your knees should be over your hips and your lower legs should be parallel to the floor. 3. Keeping your abdominal muscles tense and your knee bent, slowly lower one of your legs so your toe touches the ground. 4. Lift your leg back up to return to the starting position. ? Do  not hold your breath. ? Do not let your back arch. Keep your back flat against the ground. 5. Repeat with your other leg. Repeat __________ times. Complete this exercise __________ times a day. Posture and body mechanics Good posture and healthy body mechanics can help to relieve stress in your body's tissues and joints. Body mechanics refers to the movements and positions of your body while you do your daily activities. Posture is part of body mechanics. Good posture means:  Your spine is in its natural S-curve position (neutral).  Your shoulders are pulled back slightly.  Your head is not tipped forward. Follow these guidelines to improve your posture and body mechanics in your everyday activities. Standing   When standing, keep your spine neutral and your feet about hip width apart. Keep a slight bend in your knees. Your ears, shoulders, and hips should line up.  When you do a task in which you stand in one place for a long time, place one foot up on a stable object that is 2-4 inches (5-10 cm) high, such as a footstool. This helps keep your spine neutral. Sitting   When sitting, keep your spine neutral and keep your feet flat on the floor. Use a footrest, if necessary, and keep your thighs parallel to the floor. Avoid rounding your shoulders, and avoid tilting your head forward.  When working at a desk or a computer, keep your desk at a height where your hands are slightly lower than your elbows. Slide your chair under your desk so you are close enough to maintain good posture.  When working at a computer, place your monitor at a height where you are looking straight ahead and you do not have to tilt your head forward or downward to look at the screen. Resting  When lying down and resting, avoid positions that are most painful for you.  If you have pain with activities such as sitting, bending, stooping, or squatting, lie in a position in which your body does not bend very much.  For example, avoid curling up on your side with your arms and knees near your chest (fetal position).  If you have pain with activities such as standing for a long time or reaching with your arms, lie with your spine in a neutral position and bend your knees slightly. Try the following positions: ? Lying on your side with a pillow between your knees. ? Lying on your back with a pillow under your knees. Lifting   When lifting objects, keep your feet at least shoulder width apart and tighten your abdominal muscles.  Bend your knees and hips and keep your spine neutral. It is important to lift using the strength of your legs, not your back. Do not lock your knees straight out.  Always ask for help to lift heavy or awkward objects. This information is not intended to replace advice given to you by your health care provider. Make sure you discuss any questions you have with your health care provider. Document Released: 05/23/2005 Document Revised: 09/14/2018 Document Reviewed: 06/14/2018 Elsevier Patient Education  2020 [employer].

## 2019-02-04 NOTE — Progress Notes (Signed)
Patient presents to clinic today for ER follow-up of low back pain with left-sided sciatica. Patient endorses issue with midline and left-sided lumbar back pain over the past 3+ months. Denies any noted trauma or injury. Pain worse with movement. Initially averaging 10/10. Was seen at [facility] ER initially on 01/28/2019 and again on 02/01/2019 for this pain. Was prescribed Methocarbamol and Naproxen which he had been taking as directed. No imaging performed at either visit. Notes symptoms are much improved since Saturday. Now minimal pain with no radiation. Is concerned about the recurrence of symptoms.  No past medical history on file.  Current Outpatient Medications on File Prior to Visit  Medication Sig Dispense Refill  . amLODipine (NORVASC) 10 MG tablet TAKE 1 TABLET(10 MG) BY MOUTH DAILY 90 tablet 1  . cetirizine (ZYRTEC) 10 MG tablet Take 1 tablet (10 mg total) by mouth daily. 30 tablet 11  . methocarbamol (ROBAXIN) 750 MG tablet Take 1 tablet (750 mg total) by mouth every 8 (eight) hours as needed for muscle spasms. (Patient not taking: Reported on 08/20/2018) 45 tablet 0   No current facility-administered medications on file prior to visit.     No Known Allergies  Family History  Problem Relation Age of Onset  . Diabetes Mother   . Diabetes Father   . Heart attack Father     Social History   Socioeconomic History  . Marital status: Single    Spouse name: Not on file  . Number of children: Not on file  . Years of education: Not on file  . Highest education level: Not on file  Occupational History  . Not on file  Social Needs  . Financial resource strain: Not on file  . Food insecurity    Worry: Not on file    Inability: Not on file  . Transportation needs    Medical: Not on file    Non-medical: Not on file  Tobacco Use  . Smoking status: Never Smoker  . Smokeless tobacco: Never Used  Substance and Sexual Activity  . Alcohol use: Yes    Comment:  occasionally  . Drug use: Yes    Types: Marijuana    Comment: occasionally  . Sexual activity: Yes  Lifestyle  . Physical activity    Days per week: Not on file    Minutes per session: Not on file  . Stress: Not on file  Relationships  . Social Herbalist on phone: Not on file    Gets together: Not on file    Attends religious service: Not on file    Active member of club or organization: Not on file    Attends meetings of clubs or organizations: Not on file    Relationship status: Not on file  Other Topics Concern  . Not on file  Social History Narrative  . Not on file   Review of Systems - See HPI.  All other ROS are negative.  Ht 5\' 10"  (1.778 m)   Wt 264 lb (119.7 kg)   BMI 37.88 kg/m   Physical Exam Vitals signs reviewed.  Constitutional:      Appearance: Normal appearance.  HENT:     Head: Normocephalic and atraumatic.  Neck:     Musculoskeletal: Neck supple.  Cardiovascular:     Rate and Rhythm: Normal rate and regular rhythm.     Pulses: Normal pulses.     Heart sounds: Normal heart sounds.  Pulmonary:  Effort: Pulmonary effort is normal.  Musculoskeletal:     Right hip: Normal.     Left hip: Normal.     Cervical back: Normal.     Thoracic back: Normal.     Lumbar back: Normal.  Neurological:     General: No focal deficit present.     Mental Status: He is alert and oriented to person, place, and time.     Comments: Strength 5/5 bilateral proximal and distal lower extremities  Psychiatric:        Mood and Affect: Mood normal.     Assessment/Plan: 1. Chronic left-sided low back pain with left-sided sciatica Recent flare is calmed down with treatment. Will continue Naproxen and Methocarbamol as needed for flares. Recommend he limit heavy lifting. Home stretches reviewed and handout given. Will check x-ray to assess lumbar spine. This will help determine need for specialist or to start PT.  - DG Lumbar Spine Complete; Future   Leeanne Rio, PA-C

## 2019-02-05 ENCOUNTER — Other Ambulatory Visit: Payer: 59

## 2019-02-07 ENCOUNTER — Ambulatory Visit (INDEPENDENT_AMBULATORY_CARE_PROVIDER_SITE_OTHER): Payer: 59

## 2019-02-07 ENCOUNTER — Other Ambulatory Visit: Payer: Self-pay

## 2019-02-07 ENCOUNTER — Other Ambulatory Visit: Payer: 59

## 2019-02-07 DIAGNOSIS — G8929 Other chronic pain: Secondary | ICD-10-CM

## 2019-02-07 DIAGNOSIS — M5442 Lumbago with sciatica, left side: Secondary | ICD-10-CM | POA: Diagnosis not present

## 2019-02-12 ENCOUNTER — Other Ambulatory Visit: Payer: Self-pay

## 2019-02-12 DIAGNOSIS — G8929 Other chronic pain: Secondary | ICD-10-CM

## 2019-04-23 ENCOUNTER — Other Ambulatory Visit: Payer: Self-pay | Admitting: *Deleted

## 2019-04-23 DIAGNOSIS — I712 Thoracic aortic aneurysm, without rupture, unspecified: Secondary | ICD-10-CM

## 2019-05-23 ENCOUNTER — Other Ambulatory Visit: Payer: 59

## 2019-05-28 ENCOUNTER — Ambulatory Visit
Admission: RE | Admit: 2019-05-28 | Discharge: 2019-05-28 | Disposition: A | Discharge status: Home / Self Care | Payer: 59 | Source: Ambulatory Visit | Attending: Surgery | Admitting: Surgery

## 2019-05-28 DIAGNOSIS — I712 Thoracic aortic aneurysm, without rupture, unspecified: Secondary | ICD-10-CM

## 2019-05-29 ENCOUNTER — Ambulatory Visit: Payer: 59 | Admitting: Surgery

## 2019-05-29 ENCOUNTER — Encounter: Payer: Self-pay | Admitting: Surgery

## 2019-06-12 ENCOUNTER — Encounter: Payer: Self-pay | Admitting: Surgery

## 2019-06-12 ENCOUNTER — Ambulatory Visit (INDEPENDENT_AMBULATORY_CARE_PROVIDER_SITE_OTHER): Payer: 59 | Admitting: Surgery

## 2019-06-12 ENCOUNTER — Other Ambulatory Visit: Payer: Self-pay

## 2019-06-12 VITALS — BP 135/95 | HR 84 | Temp 98.1°F | Resp 20 | Ht 70.5 in | Wt 267.0 lb

## 2019-06-12 DIAGNOSIS — I712 Thoracic aortic aneurysm, without rupture, unspecified: Secondary | ICD-10-CM

## 2019-06-14 ENCOUNTER — Encounter: Payer: Self-pay | Admitting: Surgery

## 2019-06-14 NOTE — Progress Notes (Signed)
HPI:  The patient returns today for follow-up of a 4.1 cm fusiform ascending aortic aneurysm.  Last saw him on 05/23/2018 and he has had no change to his medical history since then.  He denies any chest pain or shortness of breath.  He does have a history of hypertension and was treated with amlodipine.  Current Outpatient Medications  Medication Sig Dispense Refill  . amLODipine (NORVASC) 10 MG tablet TAKE 1 TABLET(10 MG) BY MOUTH DAILY 90 tablet 1  . methocarbamol (ROBAXIN) 750 MG tablet Take 1 tablet (750 mg total) by mouth every 8 (eight) hours as needed for muscle spasms. 45 tablet 0   No current facility-administered medications for this visit.     Physical Exam: BP (!) 135/95 (BP Location: Left Arm, Patient Position: Sitting, Cuff Size: Large)   Pulse 84   Temp 98.1 F (36.7 C) (Skin)   Resp 20   Ht 5' 10.5" (1.791 m)   Wt 267 lb (121.1 kg)   SpO2 97% Comment: RA  BMI 37.77 kg/m  He looks well. Cardiac exam shows regular rate and rhythm with normal heart sounds. Lungs are clear.  Diagnostic Tests:  CLINICAL DATA:  49 year old male with a history of thoracic aortic aneurysm  EXAM: CT CHEST WITHOUT CONTRAST  TECHNIQUE: Multidetector CT imaging of the chest was performed following the standard protocol without IV contrast.  COMPARISON:  Prior CT scan of the chest 05/18/2018  FINDINGS: Cardiovascular: Limited evaluation in the absence of intravenous contrast. Conventional 3 vessel arch anatomy. Cardiac motion results in significant limitation in accurate measurement of the ascending thoracic aorta. The maximal diameter remains approximately 4.1 cm. The heart is at the upper limits of normal for size. Calcifications again noted along the courses of the coronary arteries. No pericardial effusion.  Mediastinum/Nodes: Unremarkable CT appearance of the thyroid gland. No suspicious mediastinal or hilar adenopathy. No soft tissue mediastinal mass. Small  hiatal hernia.  Lungs/Pleura: No suspicious pulmonary mass or nodule. Dependent atelectasis present in both lower lungs. There are a few foci of air trapping and a single small pulmonary cyst in the left lower lobe as well.  Upper Abdomen: No acute abnormality.  Musculoskeletal: No acute fracture or aggressive appearing lytic or blastic osseous lesion.  IMPRESSION: 1. Stable mild aneurysmal dilation of the ascending thoracic aorta with a maximal diameter of 4.1 cm. Recommend annual imaging followup by CTA or MRA. This recommendation follows 2010 ACCF/AHA/AATS/ACR/ASA/SCA/SCAI/SIR/STS/SVM Guidelines for the Diagnosis and Management of Patients with Thoracic Aortic Disease. Circulation. 2010; 121JN:9224643. Aortic aneurysm NOS (ICD10-I71.9) 2. Coronary artery calcifications. 3. Additional ancillary findings as above without significant interval change.  Signed,  Criselda Peaches, MD, [place]  [facility]   Electronically Signed   By: Jacqulynn Cadet M.D.   On: 05/28/2019 17:18   Impression:  This 49 year old gentleman has a stable 4.1 cm fusiform ascending aortic aneurysm which is is unchanged dating back to March 2016.  This is still well below the surgical threshold of 5.5 cm.  I reviewed the CT images with him and answered all his questions.  I stressed the importance of continued good blood pressure control and preventing further enlargement and acute aortic dissection.  Since this has been stable and is only 4.1 cm I think it is reasonable to repeat his scan in 2 years.  He is in agreement with that.  Plan:  He will return to see me in 2 years with a  CT scan of the chest without contrast.  I spent 15 minutes performing this established patient evaluation and > 50% of this time was spent face to face counseling and coordinating the care of this patient's aortic aneurysm.     Gaye Pollack,  MD [facility] [phone]

## 2019-08-21 ENCOUNTER — Telehealth: Payer: Self-pay | Admitting: Family Medicine

## 2019-08-21 MED ORDER — AMLODIPINE BESYLATE 10 MG PO TABS: ORAL_TABLET | ORAL | 0 refills | Status: DC

## 2019-08-21 NOTE — Telephone Encounter (Signed)
Pt called in asking a refill on the Amlodipine, pt uses [facility] on [address]. Please advise

## 2019-08-21 NOTE — Telephone Encounter (Signed)
[place] for #30 but needs appt

## 2019-08-21 NOTE — Telephone Encounter (Signed)
Pt has been scheduled.  °

## 2019-08-21 NOTE — Telephone Encounter (Signed)
Medication filled to pharmacy as requested.  Pt needs appt for further refills.

## 2019-08-21 NOTE — Telephone Encounter (Signed)
Please advise, per chart pt should have been out of amlodipine in January. No upcoming appts.

## 2019-09-16 ENCOUNTER — Telehealth (INDEPENDENT_AMBULATORY_CARE_PROVIDER_SITE_OTHER): Payer: 59 | Admitting: Family Medicine

## 2019-09-16 ENCOUNTER — Encounter: Payer: Self-pay | Admitting: Family Medicine

## 2019-09-16 ENCOUNTER — Other Ambulatory Visit: Payer: Self-pay

## 2019-09-16 DIAGNOSIS — I10 Essential (primary) hypertension: Secondary | ICD-10-CM | POA: Diagnosis not present

## 2019-09-16 NOTE — Progress Notes (Signed)
   Virtual Visit via Video   I connected with patient on 09/16/19 at  1:00 PM EDT by a video enabled telemedicine application and verified that I am speaking with the correct person using two identifiers.  Location patient: Home Location provider: Acupuncturist, Office Persons participating in the virtual visit: Patient, Provider, [place] (Jess B)  I discussed the limitations of evaluation and management by telemedicine and the availability of in person appointments. The patient expressed understanding and agreed to proceed.  Subjective:   HPI:   HTN- pt went to ER on 07/15/19 and BP was 157/103.  At that time he was out of medication.  Is now taking his Amlodipine 10mg  daily.  No CP, SOB, HAs, visual changes, edema.  Not checking BP regularly.  No regular exercise.    ROS:   See pertinent positives and negatives per HPI.  Patient Active Problem List   Diagnosis Date Noted  . Sciatica of right side 01/29/2018  . Physical exam 08/02/2017  . Obesity (BMI 35.0-39.9 without comorbidity) 11/03/2016  . HTN (hypertension) 11/03/2016  . Ascending aorta dilatation (HCC) 11/03/2016    Social History   Tobacco Use  . Smoking status: Never Smoker  . Smokeless tobacco: Never Used  Substance Use Topics  . Alcohol use: Yes    Comment: occasionally    Current Outpatient Medications:  .  amLODipine (NORVASC) 10 MG tablet, TAKE 1 TABLET(10 MG) BY MOUTH DAILY, Disp: 30 tablet, Rfl: 0  No Known Allergies  Objective:   There were no vitals taken for this visit. AAOx3, NAD NCAT, EOMI No obvious CN deficits Coloring WNL Pt is able to speak clearly, coherently without shortness of breath or increased work of breathing.  Thought process is linear.  Mood is appropriate.   Assessment and Plan:   HTN- chronic problem.  Pt has hx of poor control and noncompliance.  Unclear as to what current BP is but he just restarted his medication.  Discussed need for low salt diet, regular exercise,  medication and follow up compliance.  Will follow closely.   Annye Asa, MD 09/16/2019

## 2019-09-16 NOTE — Progress Notes (Signed)
I have discussed the procedure for the virtual visit with the patient who has given consent to proceed with assessment and treatment.   Pt unable to obtain vitals.   Idrees Quam L Corianne Buccellato, CMA     

## 2019-09-20 ENCOUNTER — Other Ambulatory Visit: Payer: Self-pay | Admitting: General Practice

## 2019-09-20 MED ORDER — AMLODIPINE BESYLATE 10 MG PO TABS: ORAL_TABLET | ORAL | 1 refills | Status: DC

## 2019-09-23 ENCOUNTER — Other Ambulatory Visit: Payer: Self-pay | Admitting: Family Medicine

## 2019-09-23 ENCOUNTER — Other Ambulatory Visit: Payer: Self-pay

## 2019-09-23 ENCOUNTER — Other Ambulatory Visit (INDEPENDENT_AMBULATORY_CARE_PROVIDER_SITE_OTHER): Payer: 59

## 2019-09-23 DIAGNOSIS — I10 Essential (primary) hypertension: Secondary | ICD-10-CM | POA: Diagnosis not present

## 2019-09-24 ENCOUNTER — Encounter: Payer: Self-pay | Admitting: General Practice

## 2019-09-24 LAB — CBC WITH DIFFERENTIAL/PLATELET
Basophils Absolute: 0.1 10*3/uL (ref 0.0–0.1)
Basophils Relative: 1.4 % (ref 0.0–3.0)
Eosinophils Absolute: 0.1 10*3/uL (ref 0.0–0.7)
Eosinophils Relative: 1.2 % (ref 0.0–5.0)
HCT: 41.6 % (ref 39.0–52.0)
Hemoglobin: 13.9 g/dL (ref 13.0–17.0)
Lymphocytes Relative: 34.4 % (ref 12.0–46.0)
Lymphs Abs: 1.8 10*3/uL (ref 0.7–4.0)
MCHC: 33.5 g/dL (ref 30.0–36.0)
MCV: 88.5 fl (ref 78.0–100.0)
Monocytes Absolute: 0.5 10*3/uL (ref 0.1–1.0)
Monocytes Relative: 8.8 % (ref 3.0–12.0)
Neutro Abs: 2.9 10*3/uL (ref 1.4–7.7)
Neutrophils Relative %: 54.2 % (ref 43.0–77.0)
Platelets: 204 10*3/uL (ref 150.0–400.0)
RBC: 4.7 Mil/uL (ref 4.22–5.81)
RDW: 14.5 % (ref 11.5–15.5)
WBC: 5.3 10*3/uL (ref 4.0–10.5)

## 2019-09-24 LAB — BASIC METABOLIC PANEL
BUN: 15 mg/dL (ref 6–23)
CO2: 29 mEq/L (ref 19–32)
Calcium: 9.5 mg/dL (ref 8.4–10.5)
Chloride: 102 mEq/L (ref 96–112)
Creatinine, Ser: 1.06 mg/dL (ref 0.40–1.50)
GFR: 89.84 mL/min (ref >60.00)
Glucose, Bld: 93 mg/dL (ref 70–99)
Potassium: 4.1 mEq/L (ref 3.5–5.1)
Sodium: 139 mEq/L (ref 135–145)

## 2019-10-24 ENCOUNTER — Encounter: Payer: 59 | Admitting: Family Medicine

## 2019-10-28 ENCOUNTER — Ambulatory Visit (INDEPENDENT_AMBULATORY_CARE_PROVIDER_SITE_OTHER): Payer: 59 | Admitting: Family Medicine

## 2019-10-28 ENCOUNTER — Encounter: Payer: Self-pay | Admitting: Family Medicine

## 2019-10-28 ENCOUNTER — Other Ambulatory Visit: Payer: Self-pay

## 2019-10-28 VITALS — BP 124/86 | HR 96 | Temp 99.2°F | Resp 16 | Ht 71.0 in | Wt 271.4 lb

## 2019-10-28 DIAGNOSIS — Z0000 Encounter for general adult medical examination without abnormal findings: Secondary | ICD-10-CM

## 2019-10-28 DIAGNOSIS — Z125 Encounter for screening for malignant neoplasm of prostate: Secondary | ICD-10-CM

## 2019-10-28 DIAGNOSIS — Z1211 Encounter for screening for malignant neoplasm of colon: Secondary | ICD-10-CM

## 2019-10-28 DIAGNOSIS — E785 Hyperlipidemia, unspecified: Secondary | ICD-10-CM

## 2019-10-28 NOTE — Assessment & Plan Note (Signed)
Pt's BMI is now 37.85 but coupled w/ his HTN and hyperlipidemia this qualifies as morbidly obese.  Discussed need for healthy diet and regular exercise.  Check labs to risk stratify.  Will follow.

## 2019-10-28 NOTE — Patient Instructions (Signed)
Follow up in 6 months to recheck BP We'll notify you of your lab results and make any changes if needed Continue to work on healthy diet and regular exercise- you can do it! We'll call you with your GI appt for the colonoscopy consultation If the knee keeps bothering you, follow up w/ Ortho If you change your mind about the skin tag, let me know and we can refer Call with any questions or concerns Have a great summer!!!

## 2019-10-28 NOTE — Progress Notes (Signed)
   Subjective:    Patient ID: Sean Hampton, male    DOB: Sep 03, 1970, 49 y.o.   MRN: [id number]  HPI CPE- UTD on tdap.  Due for colon cancer screen.     Review of Systems Patient reports no vision/hearing changes, anorexia, fever ,adenopathy, persistant/recurrent hoarseness, swallowing issues, chest pain, palpitations, edema, persistant/recurrent cough, hemoptysis, dyspnea (rest,exertional, paroxysmal nocturnal), gastrointestinal  bleeding (melena, rectal bleeding), abdominal pain, excessive heart burn, GU symptoms (dysuria, hematuria, voiding/incontinence issues) syncope, focal weakness, memory loss, numbness & tingling, skin/hair/nail changes, depression, anxiety, abnormal bruising/bleeding.  L knee pain   This visit occurred during the SARS-CoV-2 public health emergency.  Safety protocols were in place, including screening questions prior to the visit, additional usage of staff PPE, and extensive cleaning of exam room while observing appropriate contact time as indicated for disinfecting solutions.       Objective:   Physical Exam General Appearance:    Alert, cooperative, no distress, appears stated age  Head:    Normocephalic, without obvious abnormality, atraumatic  Eyes:    PERRL, conjunctiva/corneas clear, EOM's intact, fundi    benign, both eyes       Ears:    Normal TM's and external ear canals, both ears  Nose:   Deferred due to COVID  Throat:   Neck:   Supple, symmetrical, trachea midline, no adenopathy;       thyroid:  No enlargement/tenderness/nodules  Back:     Symmetric, no curvature, ROM normal, no CVA tenderness  Lungs:     Clear to auscultation bilaterally, respirations unlabored  Chest wall:    No tenderness or deformity  Heart:    Regular rate and rhythm, S1 and S2 normal, no murmur, rub   or gallop  Abdomen:     Soft, non-tender, bowel sounds active all four quadrants,    no masses, no organomegaly  Genitalia:    Deferred at pt's request  Rectal:      Extremities:   Extremities normal, atraumatic, no cyanosis or edema  Pulses:   2+ and symmetric all extremities  Skin:   Skin color, texture, turgor normal, no rashes or lesions  Lymph nodes:   Cervical, supraclavicular, and axillary nodes normal  Neurologic:   CNII-XII intact. Normal strength, sensation and reflexes      throughout          Assessment & Plan:

## 2019-10-28 NOTE — Assessment & Plan Note (Signed)
LDL was elevated at last check.  Was attempting to control w/ diet and exercise but pt keeps gaining weight.  Check labs and start meds if needed

## 2019-10-28 NOTE — Assessment & Plan Note (Signed)
Pt's PE WNL w/ exception of obesity.  Will refer for colon cancer screening.  UTD on Tdap.  Check PSA.  Check labs.  Anticipatory guidance provided.

## 2019-10-29 LAB — CBC WITH DIFFERENTIAL/PLATELET
Basophils Absolute: 0.1 10*3/uL (ref 0.0–0.1)
Basophils Relative: 1.2 % (ref 0.0–3.0)
Eosinophils Absolute: 0.1 10*3/uL (ref 0.0–0.7)
Eosinophils Relative: 3.2 % (ref 0.0–5.0)
HCT: 40.4 % (ref 39.0–52.0)
Hemoglobin: 13.7 g/dL (ref 13.0–17.0)
Lymphocytes Relative: 36 % (ref 12.0–46.0)
Lymphs Abs: 1.6 10*3/uL (ref 0.7–4.0)
MCHC: 34 g/dL (ref 30.0–36.0)
MCV: 87.9 fl (ref 78.0–100.0)
Monocytes Absolute: 0.3 10*3/uL (ref 0.1–1.0)
Monocytes Relative: 7.8 % (ref 3.0–12.0)
Neutro Abs: 2.2 10*3/uL (ref 1.4–7.7)
Neutrophils Relative %: 51.8 % (ref 43.0–77.0)
Platelets: 223 10*3/uL (ref 150.0–400.0)
RBC: 4.6 Mil/uL (ref 4.22–5.81)
RDW: 14 % (ref 11.5–15.5)
WBC: 4.3 10*3/uL (ref 4.0–10.5)

## 2019-10-29 LAB — BASIC METABOLIC PANEL
BUN: 20 mg/dL (ref 6–23)
CO2: 26 mEq/L (ref 19–32)
Calcium: 9.9 mg/dL (ref 8.4–10.5)
Chloride: 107 mEq/L (ref 96–112)
Creatinine, Ser: 1.06 mg/dL (ref 0.40–1.50)
GFR: 89.8 mL/min (ref >60.00)
Glucose, Bld: 102 mg/dL — ABNORMAL HIGH (ref 70–99)
Potassium: 4 mEq/L (ref 3.5–5.1)
Sodium: 141 mEq/L (ref 135–145)

## 2019-10-29 LAB — PSA: PSA: 0.61 ng/mL (ref 0.10–4.00)

## 2019-10-29 LAB — LIPID PANEL
Cholesterol: 212 mg/dL — ABNORMAL HIGH (ref 0–200)
HDL: 45.1 mg/dL (ref >39.00)
LDL Cholesterol: 136 mg/dL — ABNORMAL HIGH (ref 0–99)
NonHDL: 166.65
Total CHOL/HDL Ratio: 5
Triglycerides: 151 mg/dL — ABNORMAL HIGH (ref 0.0–149.0)
VLDL: 30.2 mg/dL (ref 0.0–40.0)

## 2019-10-29 LAB — HEPATIC FUNCTION PANEL
ALT: 27 U/L (ref 0–53)
AST: 18 U/L (ref 0–37)
Albumin: 4.8 g/dL (ref 3.5–5.2)
Alkaline Phosphatase: 66 U/L (ref 39–117)
Bilirubin, Direct: 0.1 mg/dL (ref 0.0–0.3)
Total Bilirubin: 0.5 mg/dL (ref 0.2–1.2)
Total Protein: 7.4 g/dL (ref 6.0–8.3)

## 2019-10-29 LAB — TSH: TSH: 1.62 u[IU]/mL (ref 0.35–4.50)

## 2020-03-20 ENCOUNTER — Other Ambulatory Visit: Payer: Self-pay | Admitting: General Practice

## 2020-03-20 ENCOUNTER — Other Ambulatory Visit: Payer: Self-pay | Admitting: Family Medicine

## 2020-03-20 MED ORDER — AMLODIPINE BESYLATE 10 MG PO TABS: 10.0000 mg | ORAL_TABLET | Freq: Every day | ORAL | 1 refills | Status: DC

## 2020-11-04 ENCOUNTER — Other Ambulatory Visit: Payer: Self-pay

## 2020-11-04 DIAGNOSIS — I10 Essential (primary) hypertension: Secondary | ICD-10-CM

## 2020-11-04 MED ORDER — AMLODIPINE BESYLATE 10 MG PO TABS: 10.0000 mg | ORAL_TABLET | Freq: Every day | ORAL | 0 refills | Status: DC

## 2020-11-11 ENCOUNTER — Ambulatory Visit: Payer: 59 | Admitting: Family Medicine

## 2020-11-18 ENCOUNTER — Ambulatory Visit: Payer: 59 | Admitting: Family Medicine

## 2020-11-25 ENCOUNTER — Other Ambulatory Visit: Payer: Self-pay

## 2020-11-25 ENCOUNTER — Ambulatory Visit: Payer: 59 | Admitting: Family Medicine

## 2020-11-25 ENCOUNTER — Encounter: Payer: Self-pay | Admitting: Family Medicine

## 2020-11-25 VITALS — BP 120/88 | HR 75 | Temp 98.2°F | Resp 17 | Ht 71.0 in | Wt 267.6 lb

## 2020-11-25 DIAGNOSIS — I10 Essential (primary) hypertension: Secondary | ICD-10-CM | POA: Diagnosis not present

## 2020-11-25 DIAGNOSIS — Z1211 Encounter for screening for malignant neoplasm of colon: Secondary | ICD-10-CM | POA: Diagnosis not present

## 2020-11-25 DIAGNOSIS — E785 Hyperlipidemia, unspecified: Secondary | ICD-10-CM | POA: Diagnosis not present

## 2020-11-25 LAB — BASIC METABOLIC PANEL
BUN: 14 mg/dL (ref 6–23)
CO2: 27 mEq/L (ref 19–32)
Calcium: 8.9 mg/dL (ref 8.4–10.5)
Chloride: 103 mEq/L (ref 96–112)
Creatinine, Ser: 1 mg/dL (ref 0.40–1.50)
GFR: 87.95 mL/min (ref >60.00)
Glucose, Bld: 106 mg/dL — ABNORMAL HIGH (ref 70–99)
Potassium: 3.9 mEq/L (ref 3.5–5.1)
Sodium: 138 mEq/L (ref 135–145)

## 2020-11-25 LAB — HEPATIC FUNCTION PANEL
ALT: 22 U/L (ref 0–53)
AST: 17 U/L (ref 0–37)
Albumin: 4.4 g/dL (ref 3.5–5.2)
Alkaline Phosphatase: 56 U/L (ref 39–117)
Bilirubin, Direct: 0.1 mg/dL (ref 0.0–0.3)
Total Bilirubin: 0.5 mg/dL (ref 0.2–1.2)
Total Protein: 7.1 g/dL (ref 6.0–8.3)

## 2020-11-25 LAB — CBC WITH DIFFERENTIAL/PLATELET
Basophils Absolute: 0 10*3/uL (ref 0.0–0.1)
Basophils Relative: 0.6 % (ref 0.0–3.0)
Eosinophils Absolute: 0.1 10*3/uL (ref 0.0–0.7)
Eosinophils Relative: 3.6 % (ref 0.0–5.0)
HCT: 42.2 % (ref 39.0–52.0)
Hemoglobin: 14.5 g/dL (ref 13.0–17.0)
Lymphocytes Relative: 39.4 % (ref 12.0–46.0)
Lymphs Abs: 1.4 10*3/uL (ref 0.7–4.0)
MCHC: 34.3 g/dL (ref 30.0–36.0)
MCV: 87.1 fl (ref 78.0–100.0)
Monocytes Absolute: 0.3 10*3/uL (ref 0.1–1.0)
Monocytes Relative: 8.6 % (ref 3.0–12.0)
Neutro Abs: 1.7 10*3/uL (ref 1.4–7.7)
Neutrophils Relative %: 47.8 % (ref 43.0–77.0)
Platelets: 210 10*3/uL (ref 150.0–400.0)
RBC: 4.85 Mil/uL (ref 4.22–5.81)
RDW: 13.7 % (ref 11.5–15.5)
WBC: 3.5 10*3/uL — ABNORMAL LOW (ref 4.0–10.5)

## 2020-11-25 LAB — TSH: TSH: 1.25 u[IU]/mL (ref 0.35–4.50)

## 2020-11-25 LAB — LIPID PANEL
Cholesterol: 200 mg/dL (ref 0–200)
HDL: 52.2 mg/dL (ref >39.00)
LDL Cholesterol: 130 mg/dL — ABNORMAL HIGH (ref 0–99)
NonHDL: 147.41
Total CHOL/HDL Ratio: 4
Triglycerides: 89 mg/dL (ref 0.0–149.0)
VLDL: 17.8 mg/dL (ref 0.0–40.0)

## 2020-11-25 NOTE — Patient Instructions (Signed)
Schedule your complete physical in 6 months We'll notify you of your lab results and make any changes if needed Continue to work on healthy diet and regular exercise- you can do it! We'll call you with your GI appt for the colonoscopy Call with any questions or concerns Stay Safe!  Stay Healthy! Have a great summer!!!

## 2020-11-25 NOTE — Assessment & Plan Note (Signed)
Chronic problem.  Last LDL 136.  Not currently on medication.  Will check labs and determine if meds are needed.

## 2020-11-25 NOTE — Progress Notes (Signed)
   Subjective:    Patient ID: Sean Hampton, male    DOB: 10-08-1970, 50 y.o.   MRN: [id number]  HPI HTN- chronic problem.  Adequate control today on Amlodipine 10mg  daily.  No CP, SOB, HAs, visual changes, edema.  Hyperlipidemia- last LDL 136.  Not currently on meds.  No abd pain, N/V  Obesity- pt's BMI is 37.32 but w/ HTN and hyperlipidemia this qualifies as morbidly obese.  Is down 3-4 lbs since last visit.  Pt reports no regular exercise but physical job.  No particular diet  Health Maintenance- due for colonoscopy   Review of Systems For ROS see HPI   This visit occurred during the SARS-CoV-2 public health emergency.  Safety protocols were in place, including screening questions prior to the visit, additional usage of staff PPE, and extensive cleaning of exam room while observing appropriate contact time as indicated for disinfecting solutions.      Objective:   Physical Exam Vitals reviewed.  Constitutional:      General: He is not in acute distress.    Appearance: Normal appearance. He is well-developed. He is obese.  HENT:     Head: Normocephalic and atraumatic.  Eyes:     Extraocular Movements: Extraocular movements intact.     Conjunctiva/sclera: Conjunctivae normal.     Pupils: Pupils are equal, round, and reactive to light.  Neck:     Thyroid: No thyromegaly.  Cardiovascular:     Rate and Rhythm: Normal rate and regular rhythm.     Pulses: Normal pulses.     Heart sounds: Normal heart sounds. No murmur heard. Pulmonary:     Effort: Pulmonary effort is normal. No respiratory distress.     Breath sounds: Normal breath sounds.  Abdominal:     General: Bowel sounds are normal. There is no distension.     Palpations: Abdomen is soft.  Musculoskeletal:     Cervical back: Normal range of motion and neck supple.     Right lower leg: No edema.     Left lower leg: No edema.  Lymphadenopathy:     Cervical: No cervical adenopathy.  Skin:    General: Skin is warm and  dry.  Neurological:     General: No focal deficit present.     Mental Status: He is alert and oriented to person, place, and time.     Cranial Nerves: No cranial nerve deficit.  Psychiatric:        Mood and Affect: Mood normal.        Behavior: Behavior normal.          Assessment & Plan:

## 2020-11-25 NOTE — Assessment & Plan Note (Signed)
Chronic problem.  Well controlled today on Amlodipine.  Currently asymptomatic.  Will continue to follow.

## 2020-11-25 NOTE — Assessment & Plan Note (Signed)
Ongoing issue for pt.  BMI of 37.32 combined w/ HTN and hyperlipidemia qualify as morbidly obese.  Pt is down 3-4 lbs since last visit- feels this is due to hot weather and working outside.  Encouraged increased fluids, healthy diet, and regular exercise.  Check labs to risk stratify.  Will follow.

## 2020-12-23 IMAGING — DX DG LUMBAR SPINE COMPLETE 4+V
5 series · 5 of 5 positions shown · non-contrast
Comparison: None.

CLINICAL DATA: Chronic left-sided low back pain with left-sided
sciaticachronic back pain with acute flare. Going on for 3+ months.
No prior imaging.

EXAM:
LUMBAR SPINE - COMPLETE 4+ VIEW

[lumbar spine ap]
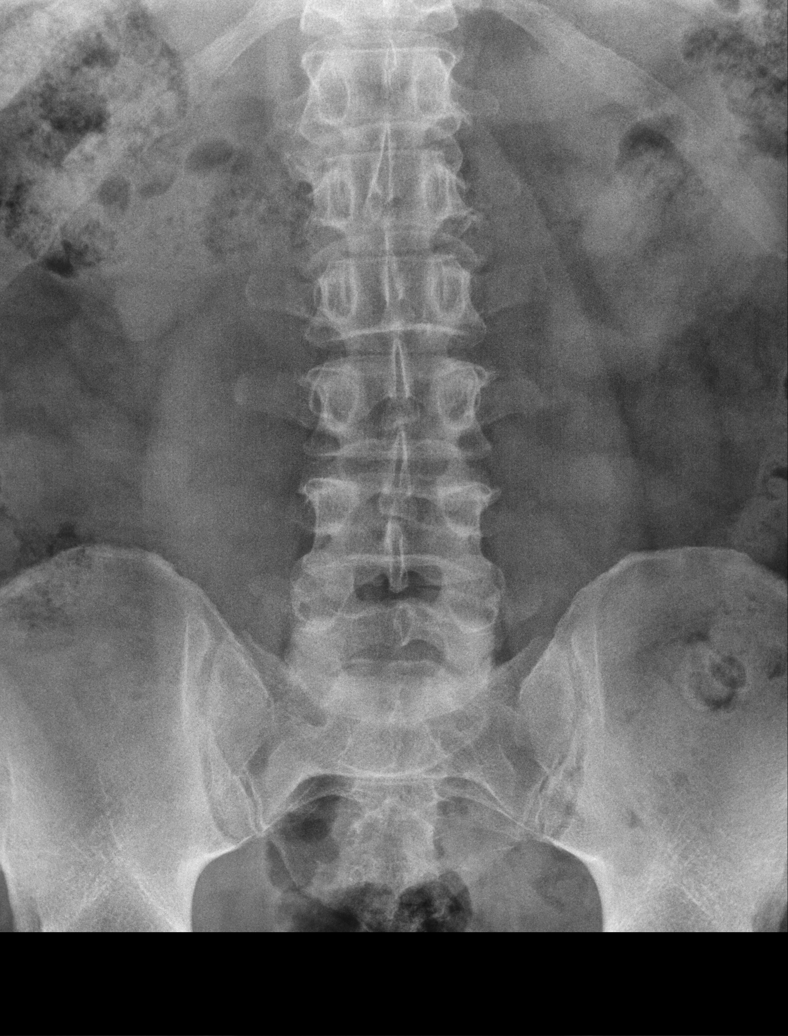

[lumbar spine oblique (1 of 2)]
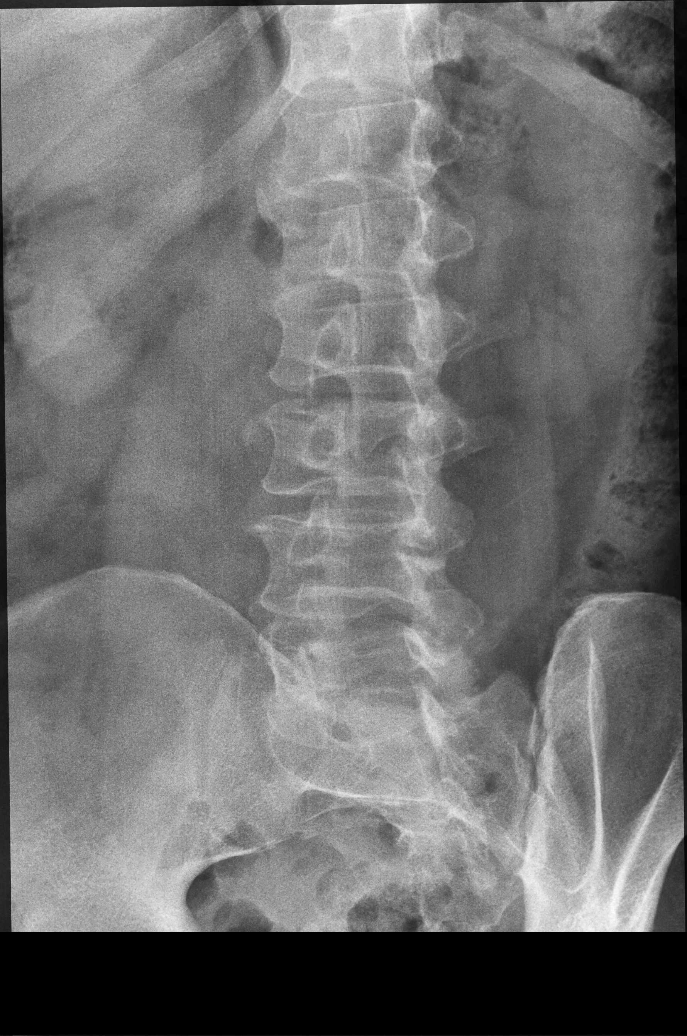

[lumbar spine oblique (2 of 2)]
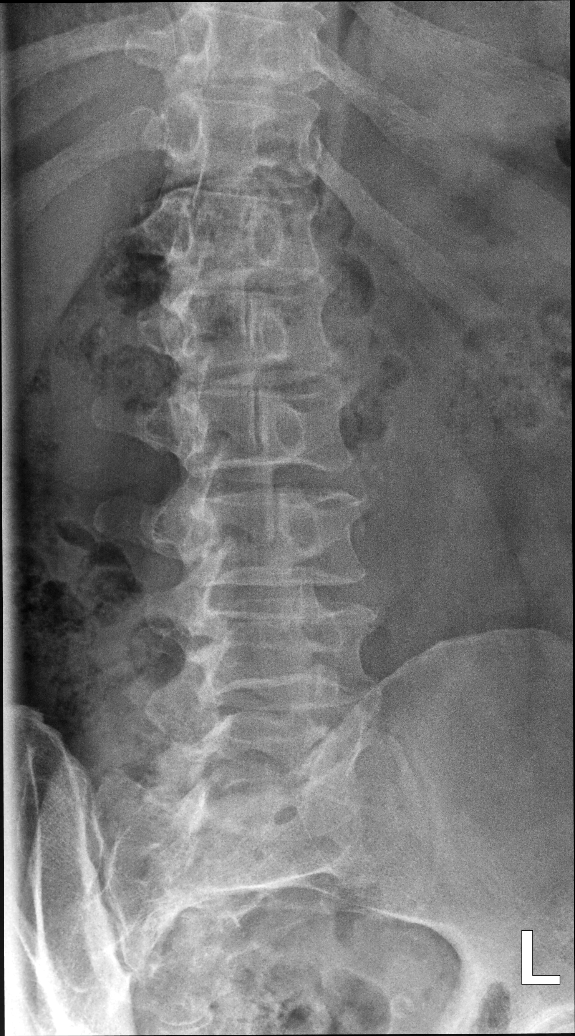

[lumbar spine lat (1 of 2)]
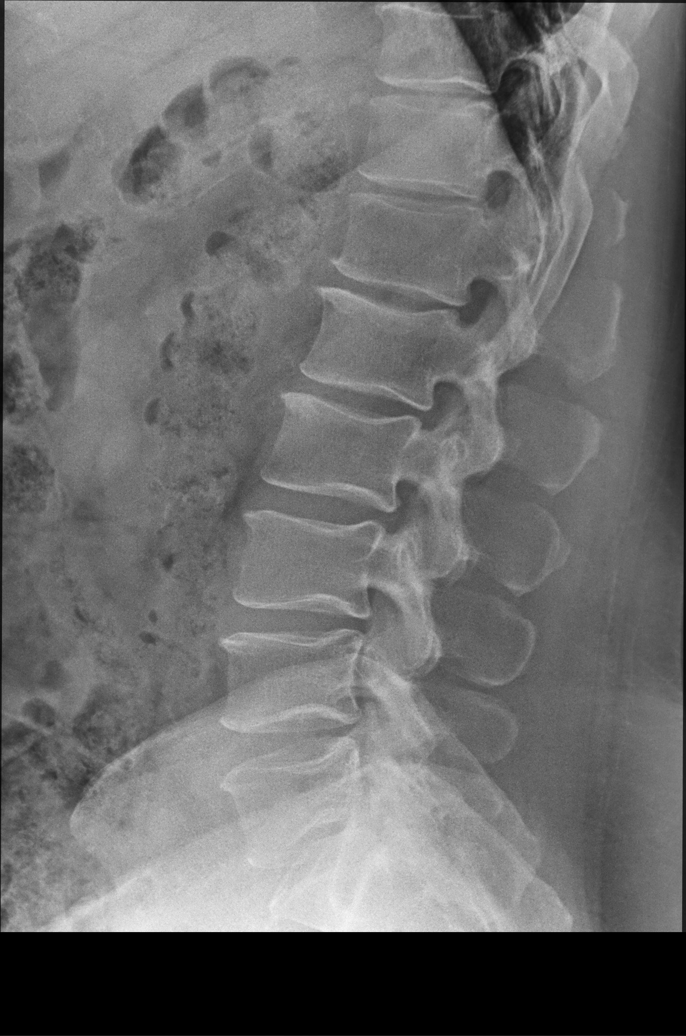

[lumbar spine lat (2 of 2)]
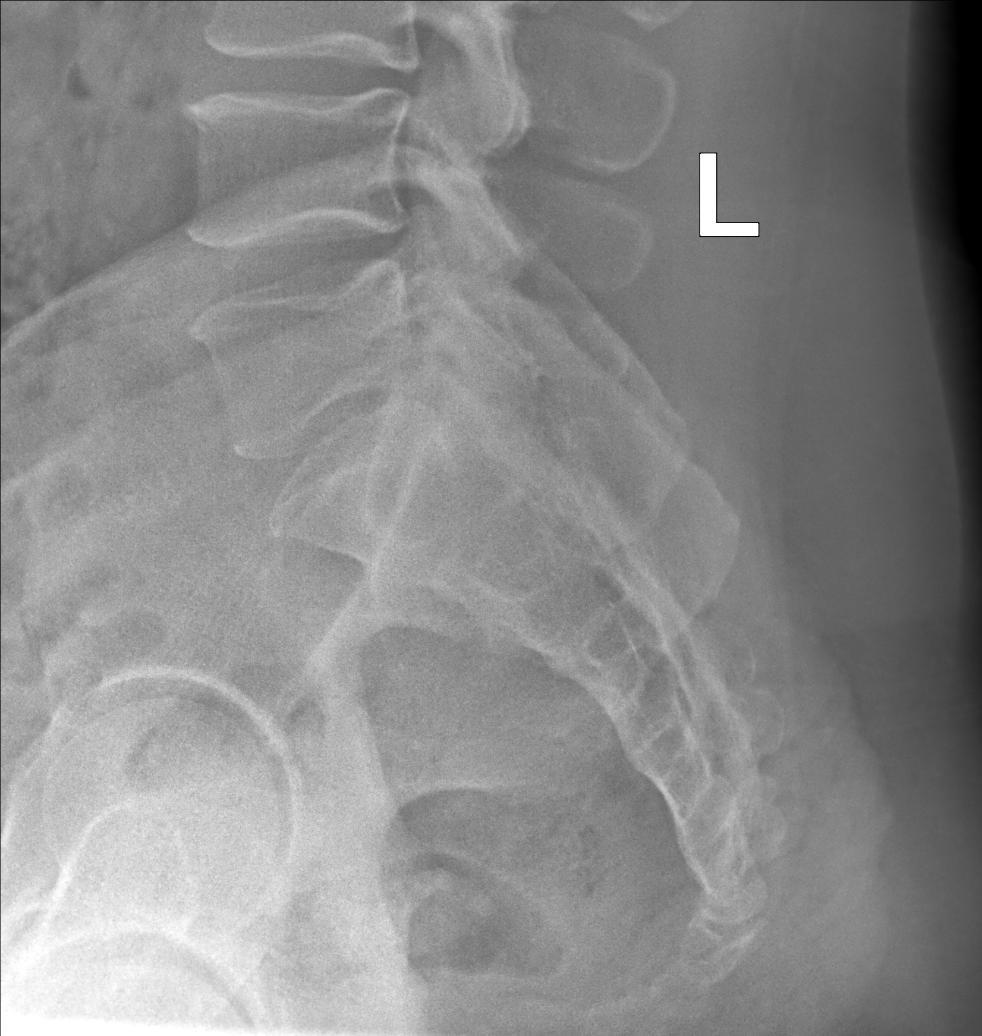

[5 of 5 positions shown; findings below may reference images not displayed]

FINDINGS: Normal alignment of lumbar vertebral bodies. No loss of vertebral
body height or disc height. Minimal endplate spurring. No pars
fracture. No subluxation.
IMPRESSION: No acute findings lumbar spine.

## 2021-02-04 ENCOUNTER — Other Ambulatory Visit: Payer: Self-pay | Admitting: Family Medicine

## 2021-02-04 DIAGNOSIS — I10 Essential (primary) hypertension: Secondary | ICD-10-CM

## 2021-02-15 ENCOUNTER — Emergency Department (INDEPENDENT_AMBULATORY_CARE_PROVIDER_SITE_OTHER): Payer: Self-pay

## 2021-02-15 ENCOUNTER — Other Ambulatory Visit: Payer: Self-pay

## 2021-02-15 ENCOUNTER — Emergency Department
Admission: EM | Admit: 2021-02-15 | Discharge: 2021-02-15 | Disposition: A | Discharge status: Home / Self Care | Payer: Self-pay | Source: Home / Self Care | Attending: Family Medicine | Admitting: Family Medicine

## 2021-02-15 DIAGNOSIS — R0781 Pleurodynia: Secondary | ICD-10-CM

## 2021-02-15 DIAGNOSIS — R0602 Shortness of breath: Secondary | ICD-10-CM

## 2021-02-15 DIAGNOSIS — R0789 Other chest pain: Secondary | ICD-10-CM

## 2021-02-15 DIAGNOSIS — W182XXA Fall in (into) shower or empty bathtub, initial encounter: Secondary | ICD-10-CM

## 2021-02-15 DIAGNOSIS — S20211A Contusion of right front wall of thorax, initial encounter: Secondary | ICD-10-CM

## 2021-02-15 MED ORDER — IBUPROFEN 800 MG PO TABS: 800.0000 mg | ORAL_TABLET | Freq: Three times a day (TID) | ORAL | 0 refills | Status: DC

## 2021-02-15 MED ORDER — HYDROCODONE-ACETAMINOPHEN 7.5-325 MG PO TABS: 1.0000 | ORAL_TABLET | Freq: Four times a day (QID) | ORAL | 0 refills | Status: DC | PRN

## 2021-02-15 NOTE — ED Triage Notes (Signed)
Pt presents to Urgent Care with c/o pain to R side/back and sob after falling in the shower 2 days ago and landing on his R side.

## 2021-02-15 NOTE — ED Provider Notes (Signed)
[name]    CSN: [id number] Arrival date & time: 02/15/21  0810      History   Chief Complaint Chief Complaint  Patient presents with   Rib Injury   Shortness of Breath    HPI Sean Hampton is a 50 y.o. male.   HPI  Patient states he slipped in the bathtub 2 days ago and fell over on his right side.  His right lower ribs hit the edge of the tub.  Ever since then he has significant rib pain.  Shortness of breath.  Pain with cough, deep breath, or laughing.  He has had no your or chills.  No sputum production. He is a non-smoker He has well-controlled hypertension Past Medical History:  Diagnosis Date   Ascending aortic aneurysm [facility])    Hypertension     Patient Active Problem List   Diagnosis Date Noted   Hyperlipidemia 10/28/2019   Sciatica of right side 01/29/2018   Physical exam 08/02/2017   Morbid obesity ([place]) 11/03/2016   HTN (hypertension) 11/03/2016   Ascending aorta dilatation ([place]) 11/03/2016    History reviewed. No pertinent surgical history.     Home Medications    Prior to Admission medications   Medication Sig Start Date End Date Taking? Authorizing Provider  HYDROcodone-acetaminophen (NORCO) 7.5-325 MG tablet Take 1 tablet by mouth every 6 (six) hours as needed for moderate pain. 02/15/21  Yes Raylene Everts, MD  ibuprofen (ADVIL) 800 MG tablet Take 1 tablet (800 mg total) by mouth 3 (three) times daily. 02/15/21  Yes Raylene Everts, MD  naproxen sodium (ALEVE) 220 MG tablet Take 220 mg by mouth.   Yes [provider]  amLODipine (NORVASC) 10 MG tablet TAKE 1 TABLET(10 MG) BY MOUTH DAILY 02/04/21   Midge Minium, MD    Family History Family History  Problem Relation Age of Onset   Diabetes Mother    Diabetes Father    Heart attack Father     Social History Social History   Tobacco Use   Smoking status: Never   Smokeless tobacco: Never  Vaping Use   Vaping Use: Never used  Substance Use Topics    Alcohol use: Yes    Alcohol/week: 3.0 standard drinks    Types: 3 Standard drinks or equivalent per week   Drug use: Yes    Types: Marijuana    Comment: occasionally     Allergies   Patient has no known allergies.   Review of Systems Review of Systems See HPI  Physical Exam Triage Vital Signs ED Triage Vitals  Enc Vitals Group     BP 02/15/21 0829 (!) 141/101     Pulse Rate 02/15/21 0829 (!) 106     Resp 02/15/21 0829 (!) 24     Temp 02/15/21 0829 98.4 F (36.9 C)     Temp Source 02/15/21 0829 Oral     SpO2 02/15/21 0829 99 %     Weight 02/15/21 0827 250 lb (113.4 kg)     Height 02/15/21 0827 '5\' 10"'$  (1.778 m)     Head Circumference --      Peak Flow --      Pain Score 02/15/21 0827 10     Pain Loc --      Pain Edu? --      Excl. in [place]? --    No data found.  Updated Vital Signs BP 139/89 (BP Location: Right Arm)   Pulse (!) 106   Temp  98.4 F (36.9 C) (Oral)   Resp (!) 24   Ht '5\' 10"'$  (1.778 m)   Wt 113.4 kg   SpO2 99%   BMI 35.87 kg/m       Physical Exam Constitutional:      General: He is not in acute distress.    Appearance: He is well-developed.     Comments: Is uncomfortable  HENT:     Head: Normocephalic and atraumatic.  Eyes:     Conjunctiva/sclera: Conjunctivae normal.     Pupils: Pupils are equal, round, and reactive to light.  Cardiovascular:     Rate and Rhythm: Normal rate and regular rhythm.     Heart sounds: Normal heart sounds.  Pulmonary:     Effort: Pulmonary effort is normal. No respiratory distress.     Breath sounds: Normal breath sounds. No decreased breath sounds, wheezing, rhonchi or rales.  Chest:     Chest wall: Tenderness present.     Comments: Chest wall tenderness in the mid axillary line at the lower rib border with no bruising, deformity, or crepitus palpable Abdominal:     General: There is no distension.     Palpations: Abdomen is soft.  Musculoskeletal:        General: Normal range of motion.     Cervical  back: Normal range of motion.  Skin:    General: Skin is warm and dry.  Neurological:     Mental Status: He is alert.     UC Treatments / Results  Labs (all labs ordered are listed, but only abnormal results are displayed) Labs Reviewed - No data to display  EKG   Radiology DG Ribs Unilateral W/Chest Right  Result Date: 02/15/2021 CLINICAL DATA:  Fall in bathtub, hit right ribs. Pain. Shortness of breath. EXAM: RIGHT RIBS AND CHEST - 3+ VIEW COMPARISON:  None. FINDINGS: No fracture or other bone lesions are seen involving the ribs. There is no evidence of pneumothorax or pleural effusion. Both lungs are clear. Heart size and mediastinal contours are within normal limits. IMPRESSION: Negative. Electronically Signed   By: Rolm Baptise M.D.   On: 02/15/2021 09:15    Procedures Procedures (including critical care time)  Medications Ordered in UC Medications - No data to display  Initial Impression / Assessment and Plan / UC Course  I have reviewed the triage vital signs and the nursing notes.  Pertinent labs & imaging results that were available during my care of the patient were reviewed by me and considered in my medical decision making (see chart for details).     X-rays are negative.  I advised the patient to take ibuprofen 3 times a day with food.  Activity as tolerated.  Avoid heavy lifting.  Discussed prevention of pneumonia.  Hydrocodone was given for more severe pain or bedtime use. Final Clinical Impressions(s) / UC Diagnoses   Final diagnoses:  Rib contusion, right, initial encounter  Acute chest wall pain     Discharge Instructions      Take ibuprofen 3 times a day Take hydrocodone if you need pain medicine in addition.  Do not drive on hydrocodone, or go to work You need to try to take a deep breath times a day to keep your lungs fully expanded If you need a cough hold a pillow up against your ribs for support See your primary care doctor if not improving by  next week     ED Prescriptions     Medication Sig Dispense Auth.  Provider   ibuprofen (ADVIL) 800 MG tablet Take 1 tablet (800 mg total) by mouth 3 (three) times daily. 21 tablet Raylene Everts, MD   HYDROcodone-acetaminophen [facility]) 7.5-325 MG tablet Take 1 tablet by mouth every 6 (six) hours as needed for moderate pain. 15 tablet Raylene Everts, MD      I have reviewed the PDMP during this encounter.   Raylene Everts, MD 02/15/21 [phone]

## 2021-02-15 NOTE — Discharge Instructions (Signed)
Take ibuprofen 3 times a day Take hydrocodone if you need pain medicine in addition.  Do not drive on hydrocodone, or go to work You need to try to take a deep breath times a day to keep your lungs fully expanded If you need a cough hold a pillow up against your ribs for support See your primary care doctor if not improving by next week

## 2021-03-01 ENCOUNTER — Other Ambulatory Visit: Payer: Self-pay

## 2021-03-01 ENCOUNTER — Ambulatory Visit: Payer: BC Managed Care – PPO | Admitting: Registered Nurse

## 2021-03-01 ENCOUNTER — Encounter: Payer: Self-pay | Admitting: Registered Nurse

## 2021-03-01 VITALS — BP 136/90 | HR 87 | Temp 98.3°F | Resp 18 | Ht 70.0 in | Wt 263.0 lb

## 2021-03-01 DIAGNOSIS — R0781 Pleurodynia: Secondary | ICD-10-CM | POA: Diagnosis not present

## 2021-03-01 MED ORDER — PREDNISONE 10 MG (21) PO TBPK: ORAL_TABLET | ORAL | 0 refills | Status: DC

## 2021-03-01 MED ORDER — TRAMADOL HCL 50 MG PO TABS
50.0000 mg | ORAL_TABLET | Freq: Three times a day (TID) | ORAL | 0 refills | Status: AC | PRN
Start: 2021-03-01 — End: 2021-03-06

## 2021-03-01 MED ORDER — METHOCARBAMOL 500 MG PO TABS: 500.0000 mg | ORAL_TABLET | Freq: Four times a day (QID) | ORAL | 1 refills | Status: DC

## 2021-03-01 MED ORDER — DICLOFENAC SODIUM 75 MG PO TBEC: 75.0000 mg | DELAYED_RELEASE_TABLET | Freq: Two times a day (BID) | ORAL | 0 refills | Status: DC

## 2021-03-01 NOTE — Progress Notes (Signed)
Established Patient Office Visit  Subjective:  Patient ID: Sean Hampton, male    DOB: Jun 01, 1971  Age: 50 y.o. MRN: [id number]  CC:  Chief Complaint  Patient presents with   Fall    Patient states that 2 weeks ago he fell in the tub and is still in pain and states that no medication is helping and might need an MRI.    HPI GERALDINE TESAR presents for rib pain  Fell in tub on 02/13/21 Landed on R side Acute pain Went to ER on 02/15/21 Given ibuprofen and Norco No relief from these Still pain, pain with breathing, laughing, coughing.  No dyspnea, sputum production, hemoptysis, or other concerning symptoms.  Past Medical History:  Diagnosis Date   Ascending aortic aneurysm ([place])    Hypertension     History reviewed. No pertinent surgical history.  Family History  Problem Relation Age of Onset   Diabetes Mother    Diabetes Father    Heart attack Father     Social History   Socioeconomic History   Marital status: Single    Spouse name: Not on file   Number of children: Not on file   Years of education: Not on file   Highest education level: Not on file  Occupational History   Not on file  Tobacco Use   Smoking status: Never   Smokeless tobacco: Never  Vaping Use   Vaping Use: Never used  Substance and Sexual Activity   Alcohol use: Yes    Alcohol/week: 3.0 standard drinks    Types: 3 Standard drinks or equivalent per week   Drug use: Yes    Types: Marijuana    Comment: occasionally   Sexual activity: Not on file  Other Topics Concern   Not on file  Social History Narrative   Not on file   Social Determinants of Health   Financial Resource Strain: Not on file  Food Insecurity: Not on file  Transportation Needs: Not on file  Physical Activity: Not on file  Stress: Not on file  Social Connections: Not on file  Intimate Partner Violence: Not on file    Outpatient Medications Prior to Visit  Medication Sig Dispense Refill   amLODipine (NORVASC)  10 MG tablet TAKE 1 TABLET(10 MG) BY MOUTH DAILY 90 tablet 0   HYDROcodone-acetaminophen (NORCO) 7.5-325 MG tablet Take 1 tablet by mouth every 6 (six) hours as needed for moderate pain. (Patient not taking: Reported on 03/01/2021) 15 tablet 0   ibuprofen (ADVIL) 800 MG tablet Take 1 tablet (800 mg total) by mouth 3 (three) times daily. (Patient not taking: Reported on 03/01/2021) 21 tablet 0   naproxen sodium (ALEVE) 220 MG tablet Take 220 mg by mouth. (Patient not taking: Reported on 03/01/2021)     No facility-administered medications prior to visit.    No Known Allergies  ROS Review of Systems  Constitutional: Negative.   HENT: Negative.    Eyes: Negative.   Respiratory: Negative.    Cardiovascular: Negative.   Gastrointestinal: Negative.   Genitourinary: Negative.   Musculoskeletal: Negative.   Skin: Negative.   Neurological: Negative.   Psychiatric/Behavioral: Negative.    All other systems reviewed and are negative.    Objective:    Physical Exam Constitutional:      General: He is not in acute distress.    Appearance: Normal appearance. He is normal weight. He is not ill-appearing, toxic-appearing or diaphoretic.  Cardiovascular:     Rate and Rhythm: Normal  rate and regular rhythm.     Heart sounds: Normal heart sounds. No murmur heard.   No friction rub. No gallop.  Pulmonary:     Effort: Pulmonary effort is normal. No respiratory distress.     Breath sounds: Normal breath sounds. No stridor. No wheezing, rhonchi or rales.  Chest:     Chest wall: No tenderness.  Musculoskeletal:        General: Tenderness (ribs along R side.) present. No swelling, deformity or signs of injury. Normal range of motion.     Right lower leg: No edema.     Left lower leg: No edema.  Skin:    General: Skin is warm and dry.  Neurological:     General: No focal deficit present.     Mental Status: He is alert and oriented to person, place, and time. Mental status is at baseline.   Psychiatric:        Mood and Affect: Mood normal.        Behavior: Behavior normal.        Thought Content: Thought content normal.        Judgment: Judgment normal.    BP 136/90   Pulse 87   Temp 98.3 F (36.8 C) (Temporal)   Resp 18   Ht 5\' 10"  (1.778 m)   Wt 263 lb (119.3 kg)   BMI 37.74 kg/m  Wt Readings from Last 3 Encounters:  03/01/21 263 lb (119.3 kg)  02/15/21 250 lb (113.4 kg)  11/25/20 267 lb 9.6 oz (121.4 kg)     There are no preventive care reminders to display for this patient.  There are no preventive care reminders to display for this patient.  Lab Results  Component Value Date   TSH 1.25 11/25/2020   Lab Results  Component Value Date   WBC 3.5 (L) 11/25/2020   HGB 14.5 11/25/2020   HCT 42.2 11/25/2020   MCV 87.1 11/25/2020   PLT 210.0 11/25/2020   Lab Results  Component Value Date   NA 138 11/25/2020   K 3.9 11/25/2020   CO2 27 11/25/2020   GLUCOSE 106 (H) 11/25/2020   BUN 14 11/25/2020   CREATININE 1.00 11/25/2020   BILITOT 0.5 11/25/2020   ALKPHOS 56 11/25/2020   AST 17 11/25/2020   ALT 22 11/25/2020   PROT 7.1 11/25/2020   ALBUMIN 4.4 11/25/2020   CALCIUM 8.9 11/25/2020   ANIONGAP 8 08/17/2014   GFR 87.95 11/25/2020   Lab Results  Component Value Date   CHOL 200 11/25/2020   Lab Results  Component Value Date   HDL 52.20 11/25/2020   Lab Results  Component Value Date   LDLCALC 130 (H) 11/25/2020   Lab Results  Component Value Date   TRIG 89.0 11/25/2020   Lab Results  Component Value Date   CHOLHDL 4 11/25/2020   No results found for: HGBA1C    Assessment & Plan:   Problem List Items Addressed This Visit   None Visit Diagnoses     Rib pain on right side    -  Primary   Relevant Medications   predniSONE (STERAPRED UNI-PAK 21 TAB) 10 MG (21) TBPK tablet   diclofenac (VOLTAREN) 75 MG EC tablet   methocarbamol (ROBAXIN) 500 MG tablet   traMADol (ULTRAM) 50 MG tablet       Meds ordered this encounter   Medications   predniSONE (STERAPRED UNI-PAK 21 TAB) 10 MG (21) TBPK tablet    Sig: Take per package instructions.  Do not skip doses. Finish entire supply.    Dispense:  1 each    Refill:  0    Order Specific Question:   Supervising Provider    Answer:   Carlota Raspberry, JEFFREY R [[id number]]   diclofenac (VOLTAREN) 75 MG EC tablet    Sig: Take 1 tablet (75 mg total) by mouth 2 (two) times daily.    Dispense:  30 tablet    Refill:  0    Order Specific Question:   Supervising Provider    Answer:   Carlota Raspberry, JEFFREY R [[id number]]   methocarbamol (ROBAXIN) 500 MG tablet    Sig: Take 1 tablet (500 mg total) by mouth 4 (four) times daily.    Dispense:  60 tablet    Refill:  1    Order Specific Question:   Supervising Provider    Answer:   Carlota Raspberry, JEFFREY R [[id number]]   traMADol (ULTRAM) 50 MG tablet    Sig: Take 1 tablet (50 mg total) by mouth every 8 (eight) hours as needed for up to 5 days.    Dispense:  15 tablet    Refill:  0    Order Specific Question:   Supervising Provider    Answer:   Carlota Raspberry, JEFFREY R [[id number]]    Follow-up: Return if symptoms worsen or fail to improve.   PLAN Prednisone and diclofenac for antiinflammatories Methocarbamol as muscle relaxer Tramadol for breakthrough pain Discussed risk, benefit, and alternative for medications. Pt voices understanding. Return if worsening or failing to improve Advised patient it may take 4-6 weeks to resolve Patient encouraged to call clinic with any questions, comments, or concerns.  Maximiano Coss, NP

## 2021-03-01 NOTE — Patient Instructions (Addendum)
  Sean Hampton -   Doristine Devoid to see you, sorry about the pain.  In brief: Diclofenac - use instead of ibuprofen. Antiinflammatory. Take twice daily as needed. Drink plenty of water. Methocarbamol - muscle relaxer  - use with caution - take up to four times daily as needed. Prednisone - steroid / antiinflammatory - take as instructed. Finish entire supply. Tramadol - use sparingly for breakthrough pain. Use with caution.  Call if any alarming symptoms come up  May take 6 weeks or so from injury to get back to normal.   Thank you  Rich    If you have lab work done today you will be contacted with your lab results within the next 2 weeks.  If you have not heard from [place] then please contact us. The fastest way to get your results is to register for My Chart.   IF you received an x-ray today, you will receive an invoice from [facility]. Please contact [facility] at [phone] with questions or concerns regarding your invoice.   IF you received labwork today, you will receive an invoice from [place]. Please contact [facility] at [phone] with questions or concerns regarding your invoice.   Our billing staff will not be able to assist you with questions regarding bills from these companies.  You will be contacted with the lab results as soon as they are available. The fastest way to get your results is to activate your My Chart account. Instructions are located on the last page of this paperwork. If you have not heard from [place] regarding the results in 2 weeks, please contact this office.

## 2021-03-03 ENCOUNTER — Telehealth: Payer: Self-pay | Admitting: Registered Nurse

## 2021-03-03 NOTE — Telephone Encounter (Signed)
Patient's employer called back about note received from [place] after Sean Hampton's visit - Employer needs to know if they should just send him home for 5-6 weeks because they do not have light duty.  - Phone:  [phone].  Please Advise.

## 2021-03-04 ENCOUNTER — Telehealth: Payer: Self-pay

## 2021-03-04 ENCOUNTER — Other Ambulatory Visit: Payer: Self-pay | Admitting: Registered Nurse

## 2021-03-04 NOTE — Telephone Encounter (Signed)
Reasonable to do so -  Can write letter, will fill out FMLA if he needs.  Thanks  [employer]

## 2021-03-04 NOTE — Telephone Encounter (Signed)
Patient's boss called back today - Irate that Sean Hampton did not call him back yesterday as he requested but called the patient instead.  He was extremely rude, had very derogatory comments about Sean Hampton and was just hateful.  I explained to him that Sean Hampton was with patients right now but that I would give him the message to return his call.  He stated that there would be hell to pay if Sean Hampton called the patient instead of him.

## 2021-03-04 NOTE — Telephone Encounter (Signed)
Called and spoke with patient and patient stated that he was unaware that his employer reached out to [place] on his behalf about not having light duty on job. Pt will reach out to his employer and update [place] on how he wants to move forward.

## 2021-03-04 NOTE — Telephone Encounter (Signed)
Attempted to call patient about his work note. Left a voicemail for patient to return the call to the office.

## 2021-03-28 NOTE — Telephone Encounter (Signed)
This concern has been previously addressed by myself and/or another provider.  If they patient has ongoing concerns, they can contact me at their convenience.  Thank you,  Rich Keshana Klemz, NP 

## 2021-04-12 IMAGING — CT CT CHEST W/O CM
2 of 4 series · 12 of 36 positions shown, 15 images · non-contrast
Comparison: Prior CT scan of the chest 05/18/2018

CLINICAL DATA: 48-year-old male with a history of thoracic aortic
aneurysm

EXAM:
CT CHEST WITHOUT CONTRAST
TECHNIQUE: Multidetector CT imaging of the chest was performed following the
standard protocol without IV contrast.

[Series 2: chest 2.00 br40 s3 · axial · 0.69mm/px · z∈[+1755,+2021]mm · 9 of 159 slices shown, 12 images (1 of 2)]
[im 13/159  mediastinal]
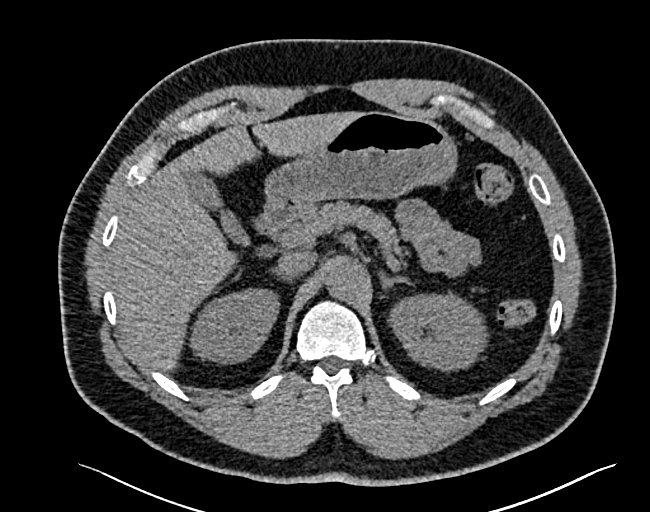
[im 13/159  lung]
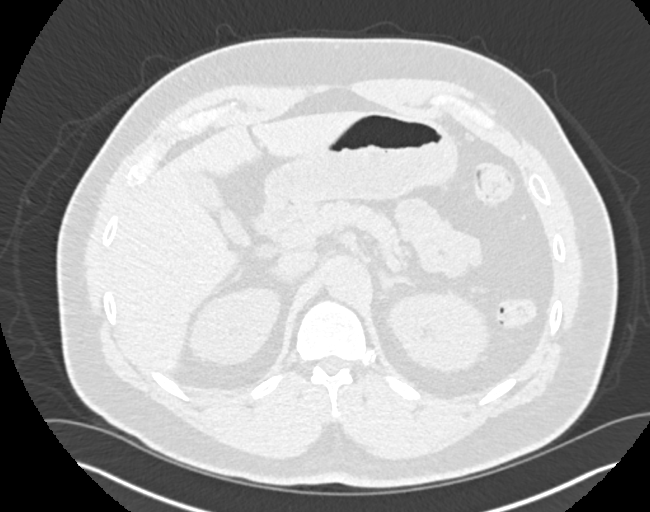
[im 37/159  lung]
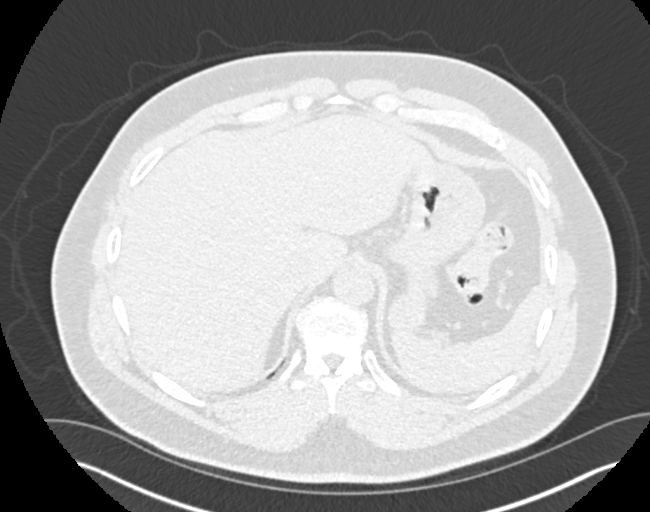
[im 49/159  lung]
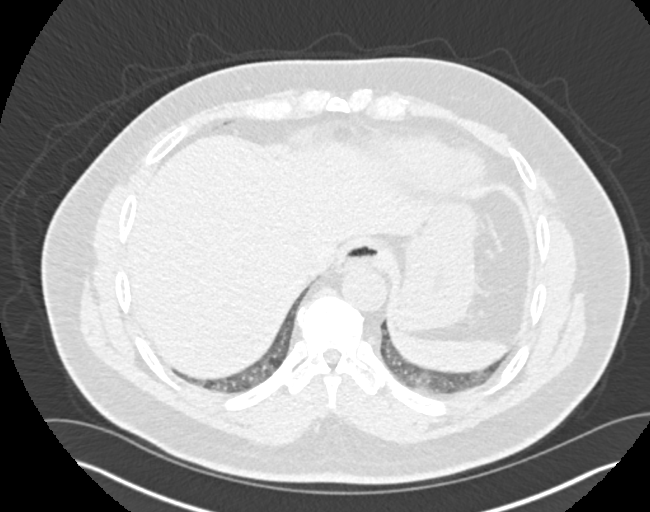
[im 61/159  lung]
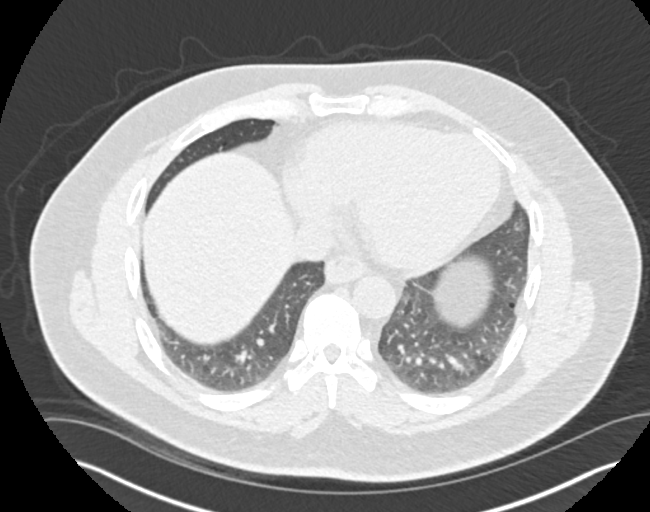
[im 86/159  mediastinal]
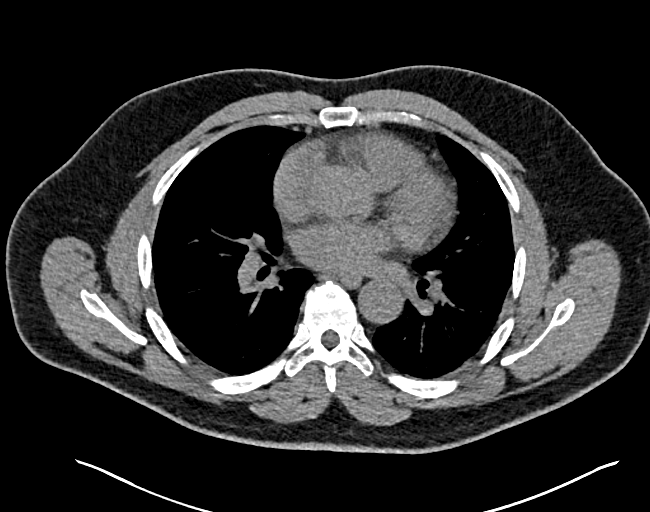
[im 86/159  lung]
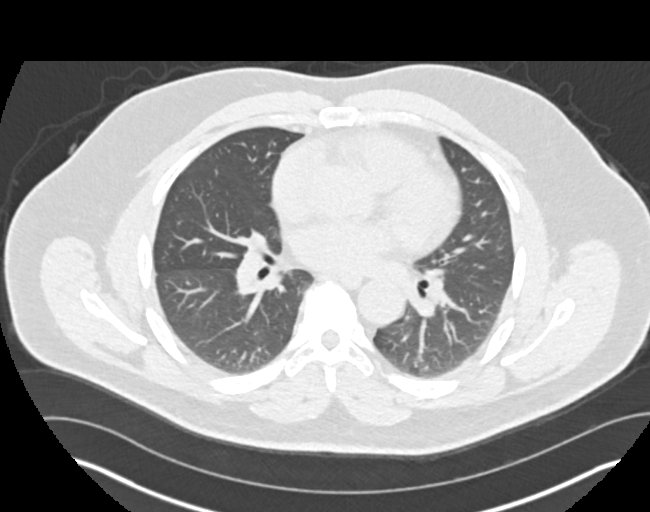
[im 98/159  lung]
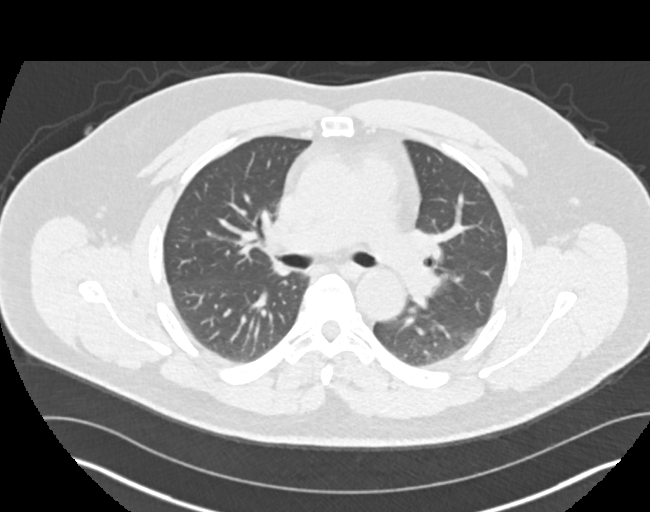
[im 110/159  lung]
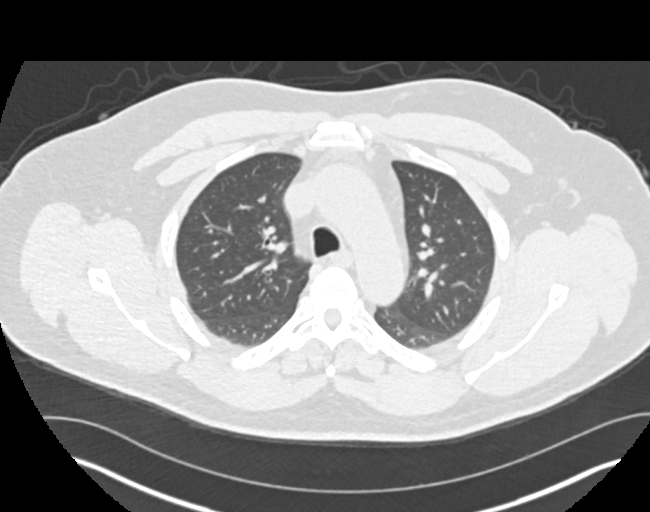
[im 134/159  lung]
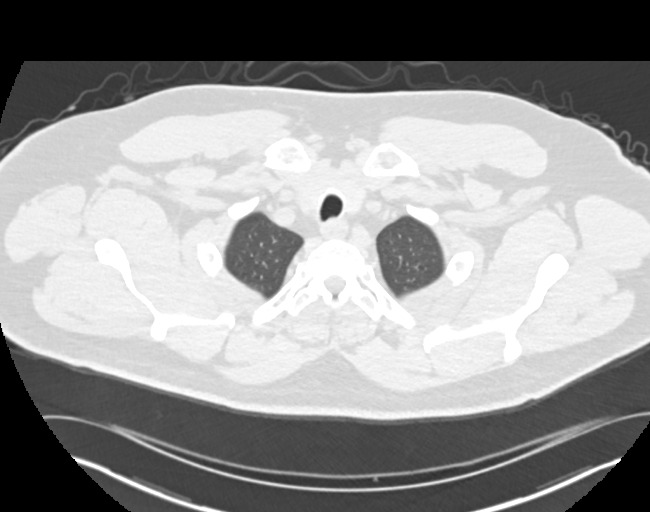
[im 146/159  mediastinal]
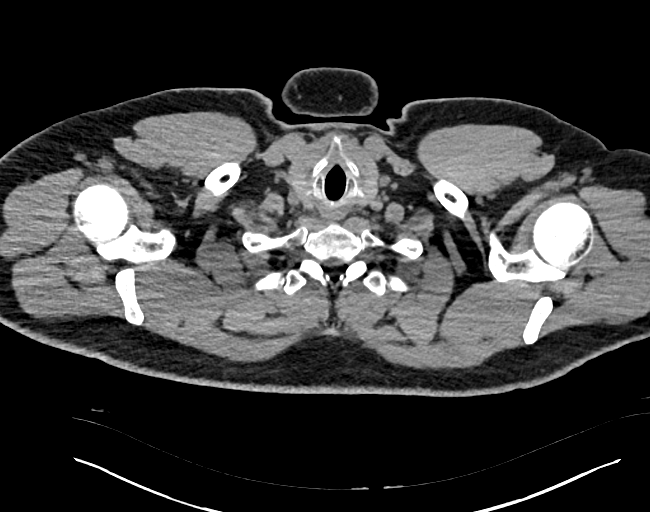
[im 146/159  lung]
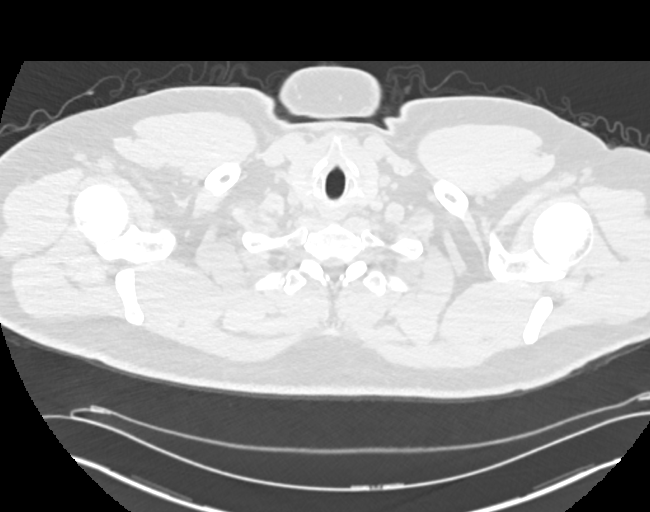

[Series 4: chest 2.00 br40 s3 · coronal · 0.62mm/px · 3 of 176 slices shown (2 of 2)]
[im 36/176  lung]
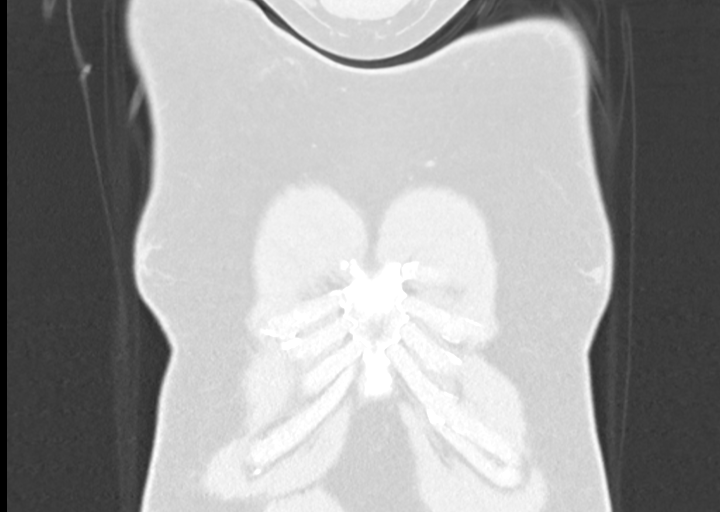
[im 71/176  lung]
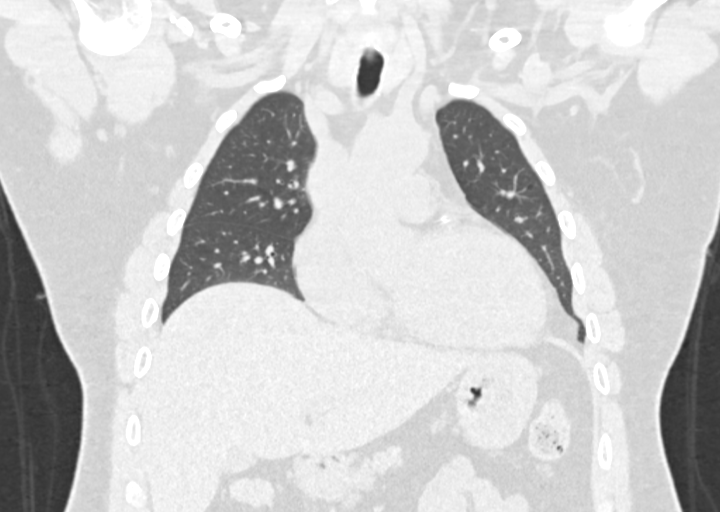
[im 106/176  lung]
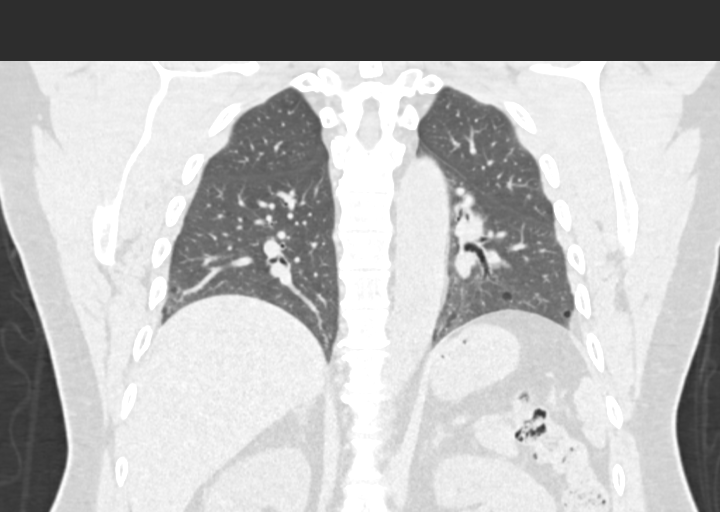

[12 of 36 positions shown; findings below may reference images not displayed]

FINDINGS: Cardiovascular: Limited evaluation in the absence of intravenous
contrast. Conventional 3 vessel arch anatomy. Cardiac motion results
in significant limitation in accurate measurement of the ascending
thoracic aorta. The maximal diameter remains approximately 4.1 cm.
The heart is at the upper limits of normal for size. Calcifications
again noted along the courses of the coronary arteries. No
pericardial effusion.

Mediastinum/Nodes: Unremarkable CT appearance of the thyroid gland.
No suspicious mediastinal or hilar adenopathy. No soft tissue
mediastinal mass. Small hiatal hernia.

Lungs/Pleura: No suspicious pulmonary mass or nodule. Dependent
atelectasis present in both lower lungs. There are a few foci of air
trapping and a single small pulmonary cyst in the left lower lobe as
well.

Upper Abdomen: No acute abnormality.

Musculoskeletal: No acute fracture or aggressive appearing lytic or
blastic osseous lesion.
IMPRESSION: 1. Stable mild aneurysmal dilation of the ascending thoracic aorta
with a maximal diameter of 4.1 cm. Recommend annual imaging followup
by CTA or MRA. This recommendation follows 6343
ACCF/AHA/AATS/ACR/ASA/SCA/ADARSH/ASPUL/HANZEN/AMEILA Guidelines for the
Diagnosis and Management of Patients with Thoracic Aortic Disease.
Circulation. 6343; 121: E266-e369. Aortic aneurysm NOS ([id number].S)
2. Coronary artery calcifications.
3. Additional ancillary findings as above without significant
interval change.

## 2021-05-08 ENCOUNTER — Other Ambulatory Visit: Payer: Self-pay | Admitting: Family Medicine

## 2021-05-08 DIAGNOSIS — I10 Essential (primary) hypertension: Secondary | ICD-10-CM

## 2021-06-15 ENCOUNTER — Encounter: Payer: Self-pay | Admitting: Gastroenterology

## 2021-06-30 ENCOUNTER — Other Ambulatory Visit: Payer: Self-pay

## 2021-06-30 ENCOUNTER — Ambulatory Visit (AMBULATORY_SURGERY_CENTER): Payer: BC Managed Care – PPO | Admitting: *Deleted

## 2021-06-30 VITALS — Ht 70.0 in | Wt 263.0 lb

## 2021-06-30 DIAGNOSIS — Z1211 Encounter for screening for malignant neoplasm of colon: Secondary | ICD-10-CM

## 2021-06-30 MED ORDER — PEG 3350-KCL-NA BICARB-NACL 420 G PO SOLR
4000.0000 mL | Freq: Once | ORAL | 0 refills | Status: AC
Start: 2021-06-30 — End: 2021-06-30

## 2021-06-30 NOTE — Progress Notes (Signed)
Patient's pre-visit was done today over the phone with the patient. Name,DOB and address verified. Patient denies any allergies to Eggs and Soy. Patient denies any problems with anesthesia/sedation. Patient is not taking any diet pills or blood thinners. No home Oxygen.   Prep instructions mailed to pt-pt aware. Patient understands to call us back with any questions or concerns. Patient is aware of our care-partner policy and [id number] safety protocol.   The patient is COVID-19 vaccinated.

## 2021-07-08 ENCOUNTER — Encounter: Payer: Self-pay | Admitting: Gastroenterology

## 2021-07-14 ENCOUNTER — Ambulatory Visit (AMBULATORY_SURGERY_CENTER): Payer: BC Managed Care – PPO | Admitting: Gastroenterology

## 2021-07-14 ENCOUNTER — Encounter: Payer: Self-pay | Admitting: Gastroenterology

## 2021-07-14 ENCOUNTER — Other Ambulatory Visit: Payer: Self-pay

## 2021-07-14 VITALS — BP 123/82 | HR 82 | Temp 99.8°F | Resp 14 | Ht 70.0 in | Wt 263.0 lb

## 2021-07-14 DIAGNOSIS — K641 Second degree hemorrhoids: Secondary | ICD-10-CM

## 2021-07-14 DIAGNOSIS — D125 Benign neoplasm of sigmoid colon: Secondary | ICD-10-CM

## 2021-07-14 DIAGNOSIS — D122 Benign neoplasm of ascending colon: Secondary | ICD-10-CM

## 2021-07-14 DIAGNOSIS — K5730 Diverticulosis of large intestine without perforation or abscess without bleeding: Secondary | ICD-10-CM

## 2021-07-14 DIAGNOSIS — Z1211 Encounter for screening for malignant neoplasm of colon: Secondary | ICD-10-CM | POA: Diagnosis not present

## 2021-07-14 MED ORDER — SODIUM CHLORIDE 0.9 % IV SOLN: 500.0000 mL | Freq: Once | INTRAVENOUS | Status: DC

## 2021-07-14 NOTE — Progress Notes (Signed)
GASTROENTEROLOGY PROCEDURE H&P NOTE   Primary Care Physician: Midge Minium, MD    Reason for Procedure:  Colon Cancer screening  Plan:    Colonoscopy  Patient is appropriate for endoscopic procedure(s) in the ambulatory ([facility]) setting.  The nature of the procedure, as well as the risks, benefits, and alternatives were carefully and thoroughly reviewed with the patient. Ample time for discussion and questions allowed. The patient understood, was satisfied, and agreed to proceed.     HPI: Sean Hampton is a 51 y.o. male who presents for colonoscopy for routine Colon Cancer screening.  No active GI symptoms.  No known family history of colon cancer or related malignancy.  Patient is otherwise without complaints or active issues today.  Past Medical History:  Diagnosis Date   Ascending aortic aneurysm    Hypertension     Past Surgical History:  Procedure Laterality Date   NO PAST SURGERIES      Prior to Admission medications   Medication Sig Start Date End Date Taking? Authorizing Provider  amLODipine (NORVASC) 10 MG tablet TAKE 1 TABLET(10 MG) BY MOUTH DAILY 05/10/21  Yes Maximiano Coss, NP    Current Outpatient Medications  Medication Sig Dispense Refill   amLODipine (NORVASC) 10 MG tablet TAKE 1 TABLET(10 MG) BY MOUTH DAILY 90 tablet 0   Current Facility-Administered Medications  Medication Dose Route Frequency Provider Last Rate Last Admin   0.9 %  sodium chloride infusion  500 mL Intravenous Once Keely Drennan V, DO        Allergies as of 07/14/2021   (No Known Allergies)    Family History  Problem Relation Age of Onset   Diabetes Mother    Diabetes Father    Heart attack Father    Colon cancer Neg Hx    Colon polyps Neg Hx    Esophageal cancer Neg Hx    Stomach cancer Neg Hx    Rectal cancer Neg Hx     Social History   Socioeconomic History   Marital status: Single    Spouse name: Not on file   Number of children: Not on file   Years  of education: Not on file   Highest education level: Not on file  Occupational History   Not on file  Tobacco Use   Smoking status: Never   Smokeless tobacco: Never  Vaping Use   Vaping Use: Never used  Substance and Sexual Activity   Alcohol use: Yes    Alcohol/week: 1.0 standard drink    Types: 1 Standard drinks or equivalent per week    Comment: 1 pink a week   Drug use: Yes    Frequency: 7.0 times per week    Types: Marijuana    Comment: 1 daily-per pt   Sexual activity: Not on file  Other Topics Concern   Not on file  Social History Narrative   Not on file   Social Determinants of Health   Financial Resource Strain: Not on file  Food Insecurity: Not on file  Transportation Needs: Not on file  Physical Activity: Not on file  Stress: Not on file  Social Connections: Not on file  Intimate Partner Violence: Not on file    Physical Exam: Vital signs in last 24 hours: @BP  127/84    Pulse 92    Temp 99.8 F (37.7 C) (Skin)    Ht 5\' 10"  (1.778 m)    Wt 263 lb (119.3 kg)    SpO2 98%  BMI 37.74 kg/m  GEN: NAD EYE: Sclerae anicteric ENT: MMM CV: Non-tachycardic Pulm: CTA b/l GI: Soft, NT/ND NEURO:  Alert & Oriented x 3   [name], DO [facility]   07/14/2021 1:08 PM

## 2021-07-14 NOTE — Progress Notes (Signed)
Pt's states no medical or surgical changes since previsit or office visit. 

## 2021-07-14 NOTE — Progress Notes (Signed)
Report to PACU, RN, vss, BBS= Clear.  

## 2021-07-14 NOTE — Patient Instructions (Signed)
Handout on polyps, diverticulosis, and hemorrhoids provided.   Await pathology.  Use fiber , for example Citrucel, Fibercon, Konsyl or metamucil  YOU HAD AN ENDOSCOPIC PROCEDURE TODAY AT [facility]:   Refer to the procedure report that was given to you for any specific questions about what was found during the examination.  If the procedure report does not answer your questions, please call your gastroenterologist to clarify.  If you requested that your care partner not be given the details of your procedure findings, then the procedure report has been included in a sealed envelope for you to review at your convenience later.  YOU SHOULD EXPECT: Some feelings of bloating in the abdomen. Passage of more gas than usual.  Walking can help get rid of the air that was put into your GI tract during the procedure and reduce the bloating. If you had a lower endoscopy (such as a colonoscopy or flexible sigmoidoscopy) you may notice spotting of blood in your stool or on the toilet paper. If you underwent a bowel prep for your procedure, you may not have a normal bowel movement for a few days.  Please Note:  You might notice some irritation and congestion in your nose or some drainage.  This is from the oxygen used during your procedure.  There is no need for concern and it should clear up in a day or so.  SYMPTOMS TO REPORT IMMEDIATELY:  Following lower endoscopy (colonoscopy or flexible sigmoidoscopy):  Excessive amounts of blood in the stool  Significant tenderness or worsening of abdominal pains  Swelling of the abdomen that is new, acute  Fever of 100F or higher  For urgent or emergent issues, a gastroenterologist can be reached at any hour by calling [phone]. Do not use MyChart messaging for urgent concerns.    DIET:  We do recommend a small meal at first, but then you may proceed to your regular diet.  Drink plenty of fluids but you should avoid alcoholic beverages for  24 hours.  ACTIVITY:  You should plan to take it easy for the rest of today and you should NOT DRIVE or use heavy machinery until tomorrow (because of the sedation medicines used during the test).    FOLLOW UP: Our staff will call the number listed on your records 48-72 hours following your procedure to check on you and address any questions or concerns that you may have regarding the information given to you following your procedure. If we do not reach you, we will leave a message.  We will attempt to reach you two times.  During this call, we will ask if you have developed any symptoms of COVID 19. If you develop any symptoms (ie: fever, flu-like symptoms, shortness of breath, cough etc.) before then, please call [phone].  If you test positive for Covid 19 in the 2 weeks post procedure, please call and report this information to [place].    If any biopsies were taken you will be contacted by phone or by letter within the next 1-3 weeks.  Please call us at [phone] if you have not heard about the biopsies in 3 weeks.    SIGNATURES/CONFIDENTIALITY: You and/or your care partner have signed paperwork which will be entered into your electronic medical record.  These signatures attest to the fact that that the information above on your After Visit Summary has been reviewed and is understood.  Full responsibility of the confidentiality of this discharge information lies with you  and/or your care-partner.

## 2021-07-14 NOTE — Op Note (Signed)
[facility] Patient Name: Sean Hampton Procedure Date: 07/14/2021 1:16 PM MRN: [id number] Endoscopist: [name] , MD Age: 51 Referring MD:  Date of Birth: 1970/08/24 Gender: Male Account #: [id number] Procedure:                Colonoscopy Indications:              Screening for colorectal malignant neoplasm, This                            is the patient's first colonoscopy Medicines:                Monitored Anesthesia Care Procedure:                Pre-Anesthesia Assessment:                           - Prior to the procedure, a History and Physical                            was performed, and patient medications and                            allergies were reviewed. The patient's tolerance of                            previous anesthesia was also reviewed. The risks                            and benefits of the procedure and the sedation                            options and risks were discussed with the patient.                            All questions were answered, and informed consent                            was obtained. Prior Anticoagulants: The patient has                            taken no previous anticoagulant or antiplatelet                            agents. ASA Grade Assessment: II - A patient with                            mild systemic disease. After reviewing the risks                            and benefits, the patient was deemed in                            satisfactory condition to undergo the procedure.  After obtaining informed consent, the colonoscope                            was passed under direct vision. Throughout the                            procedure, the patient's blood pressure, pulse, and                            oxygen saturations were monitored continuously. The                            Olympus CF-HQ190L (Serial# [id number]) Colonoscope was                            introduced through the anus  and advanced to the the                            cecum, identified by appendiceal orifice and                            ileocecal valve. The colonoscopy was performed                            without difficulty. The patient tolerated the                            procedure well. The quality of the bowel                            preparation was good. The ileocecal valve,                            appendiceal orifice, and rectum were photographed. Scope In: 1:27:41 PM Scope Out: 1:45:53 PM Scope Withdrawal Time: 0 hours 15 minutes 47 seconds  Total Procedure Duration: 0 hours 18 minutes 12 seconds  Findings:                 The perianal and digital rectal examinations were                            normal.                           Six sessile polyps were found in the sigmoid colon                            (5) and ascending colon (1). The polyps were 2 to 5                            mm in size. These polyps were removed with a cold                            snare. Resection and retrieval were complete.  Estimated blood loss was minimal.                           Multiple small and large-mouthed diverticula were                            found in the sigmoid colon.                           Non-bleeding internal hemorrhoids were found during                            retroflexion. The hemorrhoids were medium-sized. Complications:            No immediate complications. Estimated Blood Loss:     Estimated blood loss was minimal. Impression:               - Six 2 to 5 mm polyps in the sigmoid colon and in                            the ascending colon, removed with a cold snare.                            Resected and retrieved.                           - Diverticulosis in the sigmoid colon.                           - Non-bleeding internal hemorrhoids. Recommendation:           - Patient has a contact number available for                             emergencies. The signs and symptoms of potential                            delayed complications were discussed with the                            patient. Return to normal activities tomorrow.                            Written discharge instructions were provided to the                            patient.                           - Resume previous diet.                           - Continue present medications.                           - Await pathology results.                           -  Repeat colonoscopy for surveillance based on                            pathology results.                           - Return to GI clinic PRN.                           - Use fiber, for example Citrucel, Fibercon, Konsyl                            or Metamucil.                           - Internal hemorrhoids were noted on this study and                            may be amenable to hemorrhoid band ligation. If you                            are interested in further treatment of these                            hemorrhoids with band ligation, please contact my                            clinic to set up an appointment for evaluation and                            treatment. [name], MD 07/14/2021 1:50:55 PM

## 2021-07-14 NOTE — Progress Notes (Signed)
Called to room to assist during endoscopic procedure.  Patient ID and intended procedure confirmed with present staff. Received instructions for my participation in the procedure from the performing physician.  

## 2021-07-16 ENCOUNTER — Telehealth: Payer: Self-pay

## 2021-07-16 NOTE — Telephone Encounter (Signed)
°  Follow up Call-  Call back number 07/14/2021  Post procedure Call Back phone  # [phone]  Permission to leave phone message Yes  Some recent data might be hidden     Patient questions:  Do you have a fever, pain , or abdominal swelling? No. Pain Score  0 *  Have you tolerated food without any problems? Yes.    Have you been able to return to your normal activities? Yes.    Do you have any questions about your discharge instructions: Diet   No. Medications  No. Follow up visit  No.  Do you have questions or concerns about your Care? No.  Actions: * If pain score is 4 or above: No action needed, pain <4.

## 2021-07-20 ENCOUNTER — Encounter: Payer: Self-pay | Admitting: Gastroenterology

## 2021-08-14 ENCOUNTER — Other Ambulatory Visit: Payer: Self-pay | Admitting: Registered Nurse

## 2021-08-14 DIAGNOSIS — I10 Essential (primary) hypertension: Secondary | ICD-10-CM

## 2021-09-06 ENCOUNTER — Encounter: Payer: Self-pay | Admitting: Registered Nurse

## 2021-09-06 ENCOUNTER — Ambulatory Visit: Payer: BC Managed Care – PPO | Admitting: Registered Nurse

## 2021-09-06 VITALS — BP 144/96 | HR 95 | Temp 98.2°F | Resp 17 | Wt 273.4 lb

## 2021-09-06 DIAGNOSIS — J069 Acute upper respiratory infection, unspecified: Secondary | ICD-10-CM | POA: Diagnosis not present

## 2021-09-06 DIAGNOSIS — L918 Other hypertrophic disorders of the skin: Secondary | ICD-10-CM

## 2021-09-06 MED ORDER — AZELASTINE HCL 0.1 % NA SOLN: 1.0000 | Freq: Two times a day (BID) | NASAL | 12 refills | Status: DC

## 2021-09-06 MED ORDER — AMOXICILLIN 875 MG PO TABS: 875.0000 mg | ORAL_TABLET | Freq: Two times a day (BID) | ORAL | 0 refills | Status: AC

## 2021-09-06 MED ORDER — FLUTICASONE PROPIONATE 50 MCG/ACT NA SUSP: 2.0000 | Freq: Every day | NASAL | 6 refills | Status: DC

## 2021-09-06 MED ORDER — PREDNISONE 20 MG PO TABS: 20.0000 mg | ORAL_TABLET | Freq: Every day | ORAL | 0 refills | Status: DC

## 2021-09-06 MED ORDER — CETIRIZINE HCL 10 MG PO TABS: 10.0000 mg | ORAL_TABLET | Freq: Every day | ORAL | 11 refills | Status: AC

## 2021-09-06 NOTE — Patient Instructions (Signed)
Mr. Schueller -  ? ?Prednisone and amoxicillin until you run out ? ?Cetirizine daily through this, can continue if helping ?Flonase and astelin daily  ? ?Call if not improving in 1 week ? ? ? ?Follow up with Dr. Birdie Riddle for your blood pressure within 1-2 months ? ? ?Dermatology will call you to schedule ? ? ?Thanks, ? ?Rich  ?

## 2021-09-06 NOTE — Progress Notes (Signed)
? ?Established Patient Office Visit ? ?Subjective:  ?Patient ID: Sean Hampton, male    DOB: 1970-12-13  Age: 51 y.o. MRN: [id number] ? ?CC:  ?Chief Complaint  ?Patient presents with  ? URI  ?  Congestion, difficulty breathing, itchy watery eyes. Congested more than anything, has been going on for about 2-3 months  ? ? ?HPI ?Sean Hampton presents for congestion ? ?Ongoign 2-3 mo ?Worsening ?Worst at night ?Snoring as he cannot breath through his nose.  ? ?No hx of allergies, asthma, or other chronic respiratory issues. ? ?Some itching eyes. Discharge from eyes. Occasionally stuck shut on waking.  ? ?Had COVID last year. Feels like this has been a deteriorating situation since then.  ? ?Has taken OTC cetirizine occasionally - does not help much.  ? ? ? ?Also notes skin tag of face ?Near L eye lateral canthus ?Interested in derm referral for removal. ? ?Outpatient Medications Prior to Visit  ?Medication Sig Dispense Refill  ? amLODipine (NORVASC) 10 MG tablet TAKE 1 TABLET(10 MG) BY MOUTH DAILY 90 tablet 0  ? ?No facility-administered medications prior to visit.  ? ? ?Review of Systems  ?Constitutional: Negative.   ?HENT:  Positive for congestion and sinus pressure.   ?Eyes: Negative.   ?Respiratory:  Positive for cough, shortness of breath and wheezing.   ?Cardiovascular: Negative.   ?Gastrointestinal: Negative.   ?Genitourinary: Negative.   ?Musculoskeletal: Negative.   ?Skin: Negative.   ?Neurological: Negative.   ?Psychiatric/Behavioral: Negative.    ?All other systems reviewed and are negative. ? ?  ?Objective:  ?  ? ?BP (!) 144/96   Pulse 95   Temp 98.2 ?F (36.8 ?C)   Resp 17   Wt 273 lb 6.4 oz (124 kg)   SpO2 98%   BMI 39.23 kg/m?  ? ?Wt Readings from Last 3 Encounters:  ?09/06/21 273 lb 6.4 oz (124 kg)  ?07/14/21 263 lb (119.3 kg)  ?06/30/21 263 lb (119.3 kg)  ? ?Physical Exam ?Constitutional:   ?   General: He is not in acute distress. ?   Appearance: Normal appearance. He is normal weight. He is not  ill-appearing, toxic-appearing or diaphoretic.  ?HENT:  ?   Nose: Congestion and rhinorrhea present.  ?   Mouth/Throat:  ?   Pharynx: No oropharyngeal exudate or posterior oropharyngeal erythema.  ?Cardiovascular:  ?   Rate and Rhythm: Normal rate and regular rhythm.  ?   Heart sounds: Normal heart sounds. No murmur heard. ?  No friction rub. No gallop.  ?Pulmonary:  ?   Effort: Pulmonary effort is normal. No respiratory distress.  ?   Breath sounds: Normal breath sounds. No stridor. No wheezing, rhonchi or rales.  ?Chest:  ?   Chest wall: No tenderness.  ?Neurological:  ?   General: No focal deficit present.  ?   Mental Status: He is alert and oriented to person, place, and time. Mental status is at baseline.  ?Psychiatric:     ?   Mood and Affect: Mood normal.     ?   Behavior: Behavior normal.     ?   Thought Content: Thought content normal.     ?   Judgment: Judgment normal.  ? ? ?No results found for any visits on 09/06/21. ? ? ? ?The 10-year ASCVD risk score (Arnett DK, et al., 2019) is: 10.7% ? ?  ?Assessment & Plan:  ? ?Problem List Items Addressed This Visit   ?None ?Visit Diagnoses   ? ?  Upper respiratory infection with cough and congestion    -  Primary  ? Relevant Medications  ? amoxicillin (AMOXIL) 875 MG tablet  ? predniSONE (DELTASONE) 20 MG tablet  ? cetirizine (ZYRTEC) 10 MG tablet  ? fluticasone (FLONASE) 50 MCG/ACT nasal spray  ? azelastine (ASTELIN) 0.1 % nasal spray  ? Skin tag      ? Relevant Orders  ? Ambulatory referral to Dermatology  ? ?  ? ? ?Meds ordered this encounter  ?Medications  ? amoxicillin (AMOXIL) 875 MG tablet  ?  Sig: Take 1 tablet (875 mg total) by mouth 2 (two) times daily for 10 days.  ?  Dispense:  20 tablet  ?  Refill:  0  ?  Order Specific Question:   Supervising Provider  ?  Answer:   Carlota Raspberry, JEFFREY R [[id number]]  ? predniSONE (DELTASONE) 20 MG tablet  ?  Sig: Take 1 tablet (20 mg total) by mouth daily with breakfast.  ?  Dispense:  5 tablet  ?  Refill:  0  ?  Order Specific  Question:   Supervising Provider  ?  Answer:   Carlota Raspberry, JEFFREY R [[id number]]  ? cetirizine (ZYRTEC) 10 MG tablet  ?  Sig: Take 1 tablet (10 mg total) by mouth daily.  ?  Dispense:  30 tablet  ?  Refill:  11  ?  Order Specific Question:   Supervising Provider  ?  Answer:   Carlota Raspberry, JEFFREY R [[id number]]  ? fluticasone (FLONASE) 50 MCG/ACT nasal spray  ?  Sig: Place 2 sprays into both nostrils daily.  ?  Dispense:  16 g  ?  Refill:  6  ?  Order Specific Question:   Supervising Provider  ?  Answer:   Carlota Raspberry, JEFFREY R [[id number]]  ? azelastine (ASTELIN) 0.1 % nasal spray  ?  Sig: Place 1 spray into both nostrils 2 (two) times daily. Use in each nostril as directed  ?  Dispense:  30 mL  ?  Refill:  12  ?  Order Specific Question:   Supervising Provider  ?  Answer:   Carlota Raspberry, JEFFREY R [[id number]]  ? ? ?Return if symptoms worsen or fail to improve, for Follow up with Dr. Birdie Riddle regarding BP.  ? ?PLAN ?Amoxicillin and prednisone for URI ?Cetirizine, flonase, and astelin for congestion relief. ?Return if worsening or failing to improve ?Refer to derm ?Patient encouraged to call clinic with any questions, comments, or concerns. ? ?Maximiano Coss, NP ?

## 2021-11-09 DIAGNOSIS — L918 Other hypertrophic disorders of the skin: Secondary | ICD-10-CM | POA: Diagnosis not present

## 2021-11-09 DIAGNOSIS — L821 Other seborrheic keratosis: Secondary | ICD-10-CM | POA: Diagnosis not present

## 2021-11-20 ENCOUNTER — Other Ambulatory Visit: Payer: Self-pay | Admitting: Registered Nurse

## 2021-11-20 DIAGNOSIS — I10 Essential (primary) hypertension: Secondary | ICD-10-CM

## 2022-05-17 ENCOUNTER — Other Ambulatory Visit: Payer: Self-pay

## 2022-05-17 DIAGNOSIS — I10 Essential (primary) hypertension: Secondary | ICD-10-CM

## 2022-05-17 MED ORDER — AMLODIPINE BESYLATE 10 MG PO TABS: ORAL_TABLET | ORAL | 1 refills | Status: DC

## 2022-11-17 ENCOUNTER — Other Ambulatory Visit: Payer: Self-pay | Admitting: Family Medicine

## 2022-11-17 DIAGNOSIS — I10 Essential (primary) hypertension: Secondary | ICD-10-CM

## 2023-10-09 ENCOUNTER — Telehealth: Payer: Self-pay

## 2023-10-09 NOTE — Telephone Encounter (Signed)
 Error

## 2023-10-19 ENCOUNTER — Ambulatory Visit: Admitting: Family Medicine

## 2023-10-25 ENCOUNTER — Ambulatory Visit: Admitting: Family Medicine

## 2023-11-02 ENCOUNTER — Ambulatory Visit: Admitting: Family Medicine

## 2023-11-09 ENCOUNTER — Ambulatory Visit: Payer: Self-pay | Admitting: Family Medicine

## 2023-11-23 ENCOUNTER — Ambulatory Visit: Payer: Self-pay | Admitting: Family Medicine

## 2023-12-06 ENCOUNTER — Ambulatory Visit: Payer: Self-pay | Admitting: Family Medicine

## 2023-12-07 ENCOUNTER — Telehealth: Payer: Self-pay | Admitting: Family Medicine

## 2023-12-07 NOTE — Telephone Encounter (Signed)
 error

## 2023-12-18 ENCOUNTER — Ambulatory Visit: Payer: Self-pay | Admitting: Family Medicine

## 2024-01-15 ENCOUNTER — Encounter: Payer: Self-pay | Admitting: Family Medicine

## 2024-01-15 ENCOUNTER — Ambulatory Visit (INDEPENDENT_AMBULATORY_CARE_PROVIDER_SITE_OTHER): Payer: Self-pay | Admitting: Family Medicine

## 2024-01-15 VITALS — BP 140/80 | HR 79 | Temp 97.7°F | Ht 70.0 in | Wt 270.0 lb

## 2024-01-15 DIAGNOSIS — I10 Essential (primary) hypertension: Secondary | ICD-10-CM

## 2024-01-15 DIAGNOSIS — E785 Hyperlipidemia, unspecified: Secondary | ICD-10-CM

## 2024-01-15 MED ORDER — AMLODIPINE BESYLATE 10 MG PO TABS: ORAL_TABLET | ORAL | 1 refills | Status: AC

## 2024-01-15 NOTE — Progress Notes (Signed)
   Subjective:    Patient ID: Sean Hampton, male    DOB: 07-Jul-1970, 53 y.o.   MRN: [id number]  HPI HTN- chronic problem, on Amlodipine  10mg  daily.  Has been out of medication x2 months.  No CP, SOB, HA's, visual changes, edema.  Hyperlipidemia- chronic problem.  Not currently on medication.  Last LDL 130.  Obesity- weight is stable at 270.  BMI 38.74.  No regular exercise.   Review of Systems For ROS see HPI     Objective:   Physical Exam Vitals reviewed.  Constitutional:      General: He is not in acute distress.    Appearance: Normal appearance. He is well-developed. He is obese. He is not ill-appearing.  HENT:     Head: Normocephalic and atraumatic.  Eyes:     Extraocular Movements: Extraocular movements intact.     Conjunctiva/sclera: Conjunctivae normal.     Pupils: Pupils are equal, round, and reactive to light.  Neck:     Thyroid : No thyromegaly.  Cardiovascular:     Rate and Rhythm: Normal rate and regular rhythm.     Pulses: Normal pulses.     Heart sounds: Normal heart sounds. No murmur heard. Pulmonary:     Effort: Pulmonary effort is normal. No respiratory distress.     Breath sounds: Normal breath sounds.  Abdominal:     General: Bowel sounds are normal. There is no distension.     Palpations: Abdomen is soft.  Musculoskeletal:     Cervical back: Normal range of motion and neck supple.     Right lower leg: No edema.     Left lower leg: No edema.  Lymphadenopathy:     Cervical: No cervical adenopathy.  Skin:    General: Skin is warm and dry.  Neurological:     General: No focal deficit present.     Mental Status: He is alert and oriented to person, place, and time.     Cranial Nerves: No cranial nerve deficit.  Psychiatric:        Mood and Affect: Mood normal.        Behavior: Behavior normal.           Assessment & Plan:

## 2024-01-15 NOTE — Patient Instructions (Signed)
 Follow up in 6 weeks to recheck blood pressure We'll notify you of your lab results and make any changes if needed Continue to work on healthy diet and regular exercise- you can do it! Call with any questions or concerns Stay Safe!  Stay Healthy! Welcome Back!!!

## 2024-01-16 LAB — CBC WITH DIFFERENTIAL/PLATELET
Basophils Absolute: 0 K/uL (ref 0.0–0.1)
Basophils Relative: 0.7 % (ref 0.0–3.0)
Eosinophils Absolute: 0.1 K/uL (ref 0.0–0.7)
Eosinophils Relative: 3 % (ref 0.0–5.0)
HCT: 45.5 % (ref 39.0–52.0)
Hemoglobin: 14.9 g/dL (ref 13.0–17.0)
Lymphocytes Relative: 37.7 % (ref 12.0–46.0)
Lymphs Abs: 1.6 K/uL (ref 0.7–4.0)
MCHC: 32.8 g/dL (ref 30.0–36.0)
MCV: 88.8 fl (ref 78.0–100.0)
Monocytes Absolute: 0.4 K/uL (ref 0.1–1.0)
Monocytes Relative: 9.1 % (ref 3.0–12.0)
Neutro Abs: 2.1 K/uL (ref 1.4–7.7)
Neutrophils Relative %: 49.5 % (ref 43.0–77.0)
Platelets: 207 K/uL (ref 150.0–400.0)
RBC: 5.12 Mil/uL (ref 4.22–5.81)
RDW: 13.7 % (ref 11.5–15.5)
WBC: 4.2 K/uL (ref 4.0–10.5)

## 2024-01-16 LAB — TSH: TSH: 1.13 u[IU]/mL (ref 0.35–5.50)

## 2024-01-16 LAB — HEPATIC FUNCTION PANEL
ALT: 25 U/L (ref 0–53)
AST: 20 U/L (ref 0–37)
Albumin: 4.5 g/dL (ref 3.5–5.2)
Alkaline Phosphatase: 52 U/L (ref 39–117)
Bilirubin, Direct: 0.2 mg/dL (ref 0.0–0.3)
Total Bilirubin: 0.8 mg/dL (ref 0.2–1.2)
Total Protein: 7.6 g/dL (ref 6.0–8.3)

## 2024-01-16 LAB — BASIC METABOLIC PANEL WITH GFR
BUN: 13 mg/dL (ref 6–23)
CO2: 29 meq/L (ref 19–32)
Calcium: 9 mg/dL (ref 8.4–10.5)
Chloride: 103 meq/L (ref 96–112)
Creatinine, Ser: 1.14 mg/dL (ref 0.40–1.50)
GFR: 73.51 mL/min (ref >60.00)
Glucose, Bld: 83 mg/dL (ref 70–99)
Potassium: 4.4 meq/L (ref 3.5–5.1)
Sodium: 139 meq/L (ref 135–145)

## 2024-01-16 LAB — LIPID PANEL
Cholesterol: 216 mg/dL — ABNORMAL HIGH (ref 0–200)
HDL: 52.1 mg/dL (ref >39.00)
LDL Cholesterol: 142 mg/dL — ABNORMAL HIGH (ref 0–99)
NonHDL: 164.16
Total CHOL/HDL Ratio: 4
Triglycerides: 109 mg/dL (ref 0.0–149.0)
VLDL: 21.8 mg/dL (ref 0.0–40.0)

## 2024-01-17 ENCOUNTER — Ambulatory Visit: Payer: Self-pay | Admitting: Family Medicine

## 2024-01-18 ENCOUNTER — Other Ambulatory Visit: Payer: Self-pay | Admitting: Family Medicine

## 2024-01-18 DIAGNOSIS — I10 Essential (primary) hypertension: Secondary | ICD-10-CM

## 2024-01-18 NOTE — Telephone Encounter (Signed)
 Copied from CRM #[id number]. Topic: Clinical - Medication Refill >> Jan 18, 2024  3:03 PM Suzen RAMAN wrote: Medication: amLODipine  (NORVASC ) 10 MG tablet   Has the patient contacted their pharmacy? Yes   This is the patient's preferred pharmacy:   [facility] [id number] - [place], [place] - [address] [place] [address] Phone: [phone] Fax: [phone]  Is this the correct pharmacy for this prescription? Yes If no, delete pharmacy and type the correct one.   Has the prescription been filled recently? No  Is the patient out of the medication? Yes  Has the patient been seen for an appointment in the last year OR does the patient have an upcoming appointment? Yes  Can we respond through MyChart? No  Agent: Please be advised that Rx refills may take up to 3 business days. We ask that you follow-up with your pharmacy.

## 2024-02-04 NOTE — Assessment & Plan Note (Signed)
 Ongoing issue.  Not currently on medication but last LDL 130.  Check labs and determine if statin is needed

## 2024-02-04 NOTE — Assessment & Plan Note (Signed)
 Ongoing issue.  Weight is stable, BMI 38.74.  Stressed need for healthy diet, regular exercise.  Check labs to risk stratify.

## 2024-02-04 NOTE — Assessment & Plan Note (Signed)
 Chronic problem.  On Amlodipine  10mg  daily but has been out of medication for months.  BP not at goal.  Need to restart medication and have him f/u to ensure BP is back in normal range.  Pt expressed understanding and is in agreement w/ plan.

## 2024-03-01 ENCOUNTER — Ambulatory Visit: Payer: Self-pay | Admitting: Family Medicine
